# Patient Record
Sex: Female | Born: 1975 | Race: White | Hispanic: No | Marital: Married | Smoking: Current every day smoker
Health system: Southern US, Community
[De-identification: ages and names within clinical notes are randomized; demographics above are authoritative.]

## PROBLEM LIST (undated history)

## (undated) DIAGNOSIS — C50919 Malignant neoplasm of unspecified site of unspecified female breast: Secondary | ICD-10-CM

## (undated) HISTORY — PX: LAPAROSCOPIC BILATERAL SALPINGO OOPHERECTOMY: SHX5890

## (undated) HISTORY — PX: MASTECTOMY: SHX3

---

## 2011-11-06 ENCOUNTER — Emergency Department: Payer: Self-pay | Admitting: Emergency Medicine

## 2011-11-10 ENCOUNTER — Emergency Department: Payer: Self-pay | Admitting: Emergency Medicine

## 2017-08-13 ENCOUNTER — Encounter: Payer: Self-pay | Admitting: Emergency Medicine

## 2017-08-13 ENCOUNTER — Emergency Department
Admission: EM | Admit: 2017-08-13 | Discharge: 2017-08-13 | Disposition: A | Discharge status: Home / Self Care | Payer: Medicaid Other | Attending: Emergency Medicine | Admitting: Emergency Medicine

## 2017-08-13 ENCOUNTER — Emergency Department: Payer: Medicaid Other

## 2017-08-13 DIAGNOSIS — X500XXA Overexertion from strenuous movement or load, initial encounter: Secondary | ICD-10-CM | POA: Insufficient documentation

## 2017-08-13 DIAGNOSIS — Y939 Activity, unspecified: Secondary | ICD-10-CM | POA: Insufficient documentation

## 2017-08-13 DIAGNOSIS — S92354A Nondisplaced fracture of fifth metatarsal bone, right foot, initial encounter for closed fracture: Secondary | ICD-10-CM | POA: Diagnosis not present

## 2017-08-13 DIAGNOSIS — S99921A Unspecified injury of right foot, initial encounter: Secondary | ICD-10-CM | POA: Diagnosis present

## 2017-08-13 DIAGNOSIS — W108XXA Fall (on) (from) other stairs and steps, initial encounter: Secondary | ICD-10-CM | POA: Diagnosis not present

## 2017-08-13 DIAGNOSIS — F17210 Nicotine dependence, cigarettes, uncomplicated: Secondary | ICD-10-CM | POA: Diagnosis not present

## 2017-08-13 DIAGNOSIS — Y929 Unspecified place or not applicable: Secondary | ICD-10-CM | POA: Diagnosis not present

## 2017-08-13 DIAGNOSIS — Y999 Unspecified external cause status: Secondary | ICD-10-CM | POA: Diagnosis not present

## 2017-08-13 HISTORY — DX: Malignant neoplasm of unspecified site of unspecified female breast: C50.919

## 2017-08-13 MED ORDER — OXYCODONE-ACETAMINOPHEN 5-325 MG PO TABS: 1.0000 | ORAL_TABLET | Freq: Four times a day (QID) | ORAL | 0 refills | Status: DC | PRN

## 2017-08-13 MED ORDER — NAPROXEN 500 MG PO TABS: 500.0000 mg | ORAL_TABLET | Freq: Two times a day (BID) | ORAL | Status: DC

## 2017-08-13 NOTE — Discharge Instructions (Signed)
Open shoe for 2 weeks as directed.

## 2017-08-13 NOTE — ED Triage Notes (Signed)
R foot pain. States last night about 11pm had episode of SVT, was going down stairs to go to hospital to be seen and got weak and twisted R foot. Patient was seen at [facility] hospital for SVT which is resolved but was not worked up for toot at that time. Foot swollen and painful.

## 2017-08-13 NOTE — ED Provider Notes (Signed)
[facility] Emergency Department Provider Note   ____________________________________________   First MD Initiated Contact with Patient 08/13/17 1341     (approximate)  I have reviewed the triage vital signs and the nursing notes.   HISTORY  Chief Complaint Foot Pain    HPI Brenda Bryan is a 41 y.o. female patient complaining of right foot pain secondary to a twisting incident. Patient state last night she had as episode of SVT was going down some steps when she got weak and was her right foot. Patient was treated for the SVT in [facility] but was not evaluated for the foot pain which have become more swollen and painful.Patient rates pain as a 10 over 10. No palliative measures for this complaint.   Past Medical History:  Diagnosis Date  . Breast cancer ([place])     There are no active problems to display for this patient.   Past Surgical History:  Procedure Laterality Date  . LAPAROSCOPIC BILATERAL SALPINGO OOPHERECTOMY    . MASTECTOMY Bilateral     Prior to Admission medications   Medication Sig Start Date End Date Taking? Authorizing Provider  naproxen (NAPROSYN) 500 MG tablet Take 1 tablet (500 mg total) by mouth 2 (two) times daily with a meal. 08/13/17   Sable Feil, PA-C  oxyCODONE-acetaminophen (ROXICET) 5-325 MG tablet Take 1 tablet by mouth every 6 (six) hours as needed. 08/13/17 08/13/18  Sable Feil, PA-C    Allergies Patient has no known allergies.  No family history on file.  Social History Social History  Substance Use Topics  . Smoking status: Current Every Day Smoker    Packs/day: 0.50    Types: Cigarettes  . Smokeless tobacco: Not on file  . Alcohol use Not on file    Review of Systems Constitutional: No fever/chills Eyes: No visual changes. ENT: No sore throat. Cardiovascular: Denies chest pain. Respiratory: Denies shortness of breath. Gastrointestinal: No abdominal pain.  No nausea, no vomiting.  No  diarrhea.  No constipation. Genitourinary: Negative for dysuria. Musculoskeletal: Right foot pain Skin: Negative for rash. Neurological: Negative for headaches, focal weakness or numbness.   ____________________________________________   PHYSICAL EXAM:  VITAL SIGNS: ED Triage Vitals  Enc Vitals Group     BP 08/13/17 1257 (!) 152/89     Pulse Rate 08/13/17 1257 98     Resp 08/13/17 1257 20     Temp 08/13/17 1257 97.6 F (36.4 C)     Temp Source 08/13/17 1257 Oral     SpO2 08/13/17 1257 98 %     Weight 08/13/17 1257 150 lb (68 kg)     Height 08/13/17 1257 5\' 4"  (1.626 m)     Head Circumference --      Peak Flow --      Pain Score 08/13/17 1256 10     Pain Loc --      Pain Edu? --      Excl. in [place]? --     Constitutional: Alert and oriented. Well appearing and in no acute distress. Cardiovascular: Normal rate, regular rhythm. Grossly normal heart sounds.  Good peripheral circulation. Respiratory: Normal respiratory effort.  No retractions. Lungs CTAB. Musculoskeletal: No lower extremity tenderness nor edema.  No joint effusions.Right foot pain Neurologic:  Normal speech and language. No gross focal neurologic deficits are appreciated. No gait instability. Skin:  Skin is warm, dry and intact. No rash noted. Psychiatric: Mood and affect are normal. Speech and behavior are normal.  ____________________________________________  LABS (all labs ordered are listed, but only abnormal results are displayed)  Labs Reviewed - No data to display ____________________________________________  EKG   ____________________________________________  RADIOLOGY  Dg Foot Complete Right  Result Date: 08/13/2017 CLINICAL DATA:  Right foot pain and swelling after missing a step on stairs and twisting foot last night. Denies previous injury or surgery to right foot. Pt shielded. EXAM: RIGHT FOOT COMPLETE - 3+ VIEW COMPARISON:  None. FINDINGS: There is an oblique fracture of the fifth  metatarsal, associated soft tissue swelling. There is minimal displacement at the fracture site. IMPRESSION: Minimally displaced fifth metatarsal fracture. Electronically Signed   By: Nolon Nations M.D.   On: 08/13/2017 13:40    __X-rays reveal midshaft nondisplaced fractures of the fifth metatarsal the right foot. __________________________________________   PROCEDURES  Procedure(s) performed: None  Procedures  Critical Care performed: No  ____________________________________________   INITIAL IMPRESSION / ASSESSMENT AND PLAN / ED COURSE  Pertinent labs & imaging results that were available during my care of the patient were reviewed by me and considered in my medical decision making (see chart for details).  Foot pain and edema secondary to nondisplaced fractured fifth metatarsal right foot. Discuss x-ray finding with patient. Patient given discharge care instructions. Patient given open shoe and advised follow-up with podiatry for definitive evaluation and treatment.      ____________________________________________   FINAL CLINICAL IMPRESSION(S) / ED DIAGNOSES  Final diagnoses:  Nondisplaced fracture of fifth metatarsal bone, right foot, initial encounter for closed fracture      NEW MEDICATIONS STARTED DURING THIS VISIT:  New Prescriptions   NAPROXEN (NAPROSYN) 500 MG TABLET    Take 1 tablet (500 mg total) by mouth 2 (two) times daily with a meal.   OXYCODONE-ACETAMINOPHEN (ROXICET) 5-325 MG TABLET    Take 1 tablet by mouth every 6 (six) hours as needed.     Note:  This document was prepared using Dragon voice recognition software and may include unintentional dictation errors.    Sable Feil, PA-C 08/13/17 1356    Lavonia Drafts, MD 08/13/17 [phone]

## 2018-08-10 ENCOUNTER — Emergency Department
Admission: EM | Admit: 2018-08-10 | Discharge: 2018-08-10 | Disposition: A | Discharge status: Home / Self Care | Payer: Medicaid Other | Attending: Emergency Medicine | Admitting: Emergency Medicine

## 2018-08-10 ENCOUNTER — Other Ambulatory Visit: Payer: Self-pay

## 2018-08-10 ENCOUNTER — Emergency Department: Payer: Medicaid Other

## 2018-08-10 DIAGNOSIS — I471 Supraventricular tachycardia: Secondary | ICD-10-CM | POA: Insufficient documentation

## 2018-08-10 DIAGNOSIS — Z853 Personal history of malignant neoplasm of breast: Secondary | ICD-10-CM | POA: Insufficient documentation

## 2018-08-10 DIAGNOSIS — E860 Dehydration: Secondary | ICD-10-CM | POA: Insufficient documentation

## 2018-08-10 DIAGNOSIS — R002 Palpitations: Secondary | ICD-10-CM | POA: Diagnosis present

## 2018-08-10 DIAGNOSIS — F17210 Nicotine dependence, cigarettes, uncomplicated: Secondary | ICD-10-CM | POA: Insufficient documentation

## 2018-08-10 LAB — CBC
HCT: 51.8 % — ABNORMAL HIGH (ref 35.0–47.0)
Hemoglobin: 18.1 g/dL — ABNORMAL HIGH (ref 12.0–16.0)
MCH: 35.1 pg — ABNORMAL HIGH (ref 26.0–34.0)
MCHC: 34.9 g/dL (ref 32.0–36.0)
MCV: 100.5 fL — ABNORMAL HIGH (ref 80.0–100.0)
PLATELETS: 278 10*3/uL (ref 150–440)
RBC: 5.15 MIL/uL (ref 3.80–5.20)
RDW: 13 % (ref 11.5–14.5)
WBC: 11.2 10*3/uL — AB (ref 3.6–11.0)

## 2018-08-10 LAB — BASIC METABOLIC PANEL
ANION GAP: 13 (ref 5–15)
BUN: 11 mg/dL (ref 6–20)
CALCIUM: 9.2 mg/dL (ref 8.9–10.3)
CO2: 24 mmol/L (ref 22–32)
Chloride: 103 mmol/L (ref 98–111)
Creatinine, Ser: 0.84 mg/dL (ref 0.44–1.00)
GFR calc Af Amer: >60 mL/min (ref >60)
Glucose, Bld: 161 mg/dL — ABNORMAL HIGH (ref 70–99)
Potassium: 3.2 mmol/L — ABNORMAL LOW (ref 3.5–5.1)
SODIUM: 140 mmol/L (ref 135–145)

## 2018-08-10 LAB — MAGNESIUM: Magnesium: 1.9 mg/dL (ref 1.7–2.4)

## 2018-08-10 LAB — TSH: TSH: 1.442 u[IU]/mL (ref 0.350–4.500)

## 2018-08-10 MED ORDER — ADENOSINE 6 MG/2ML IV SOLN
6.0000 mg | Freq: Once | INTRAVENOUS | Status: AC
  Administered 2018-08-10: 6 mg via INTRAVENOUS

## 2018-08-10 MED ORDER — ADENOSINE 12 MG/4ML IV SOLN
12.0000 mg | Freq: Once | INTRAVENOUS | Status: AC
  Administered 2018-08-10: 12 mg via INTRAVENOUS

## 2018-08-10 MED ORDER — SODIUM CHLORIDE 0.9 % IV BOLUS
1000.0000 mL | Freq: Once | INTRAVENOUS | Status: AC
  Administered 2018-08-10: 1000 mL via INTRAVENOUS

## 2018-08-10 NOTE — ED Notes (Signed)
First Nurse Note: Patient walked into registration, color pale, diaphoretic.  HR rapid unable to count apically.  Placed in [facility] and taken to Rm 15.  Patient states she has hx of SVT and this episode has lasted for several hours without relief.  Alert and oriented.

## 2018-08-10 NOTE — ED Triage Notes (Signed)
Pt arrives via POV from home, arrives in SVT, HR 184. Dr. Alfred Levins bedside.

## 2018-08-10 NOTE — ED Provider Notes (Signed)
[facility] Emergency Department Provider Note  ____________________________________________  Time seen: Approximately 9:15 AM  I have reviewed the triage vital signs and the nursing notes.   HISTORY  Chief Complaint SVT   HPI Brenda Bryan is a 42 y.o. female history of breast cancer currently on remission and SVT who presents for evaluation of palpitations.  Patient reports that her symptoms started at midnight, she is complaining of palpitations and fast heart rate which has persisted throughout the night.  She has tried Valsalva maneuvers at home with no significant relief.  Patient has required 2 prior visits to the emergency room for medical conversion.  She denies chest pain or shortness of breath, fever chills, vomiting, personal or family history of blood clots, recent travel immobilization, leg pain or swelling.  Past Medical History:  Diagnosis Date  . Breast cancer [facility])     Past Surgical History:  Procedure Laterality Date  . LAPAROSCOPIC BILATERAL SALPINGO OOPHERECTOMY    . MASTECTOMY Bilateral     Prior to Admission medications   Not on File    Allergies Patient has no known allergies.  History reviewed. No pertinent family history.  Social History Social History   Tobacco Use  . Smoking status: Current Every Day Smoker    Packs/day: 0.50    Types: Cigarettes  . Smokeless tobacco: Never Used  Substance Use Topics  . Alcohol use: Yes    Alcohol/week: 3.0 standard drinks    Types: 3 Glasses of wine per week    Frequency: Never  . Drug use: Never    Review of Systems  Constitutional: Negative for fever. Eyes: Negative for visual changes. ENT: Negative for sore throat. Neck: No neck pain  Cardiovascular: Negative for chest pain. + palpitations Respiratory: Negative for shortness of breath. Gastrointestinal: Negative for abdominal pain, vomiting or diarrhea. Genitourinary: Negative for dysuria. Musculoskeletal: Negative  for back pain. Skin: Negative for rash. Neurological: Negative for headaches, weakness or numbness. Psych: No SI or HI  ____________________________________________   PHYSICAL EXAM:  VITAL SIGNS: ED Triage Vitals  Enc Vitals Group     BP 08/10/18 0849 (!) 177/125     Pulse Rate 08/10/18 0849 (!) 188     Resp 08/10/18 0849 (!) 23     Temp 08/10/18 0900 98.6 F (37 C)     Temp Source 08/10/18 0900 Oral     SpO2 08/10/18 0849 99 %     Weight 08/10/18 0900 149 lb 14.6 oz (68 kg)     Height 08/10/18 0900 5\' 4"  (1.626 m)     Head Circumference --      Peak Flow --      Pain Score 08/10/18 0900 0     Pain Loc --      Pain Edu? --      Excl. in [place]? --     Constitutional: Alert and oriented. Well appearing and in no apparent distress. HEENT:      Head: Normocephalic and atraumatic.         Eyes: Conjunctivae are normal. Sclera is non-icteric.       Mouth/Throat: Mucous membranes are moist.       Neck: Supple with no signs of meningismus. Cardiovascular: Regular rhythm with tachycardic rate. No murmurs, gallops, or rubs. 2+ symmetrical distal pulses are present in all extremities. No JVD. Respiratory: Normal respiratory effort. Lungs are clear to auscultation bilaterally. No wheezes, crackles, or rhonchi.  Gastrointestinal: Soft, non tender, and non distended with positive  bowel sounds. No rebound or guarding. Musculoskeletal: Nontender with normal range of motion in all extremities. No edema, cyanosis, or erythema of extremities. Neurologic: Normal speech and language. Face is symmetric. Moving all extremities. No gross focal neurologic deficits are appreciated. Skin: Skin is warm, dry and intact. No rash noted. Psychiatric: Mood and affect are normal. Speech and behavior are normal.  ____________________________________________   LABS (all labs ordered are listed, but only abnormal results are displayed)  Labs Reviewed  CBC - Abnormal; Notable for the following components:        Result Value   WBC 11.2 (*)    Hemoglobin 18.1 (*)    HCT 51.8 (*)    MCV 100.5 (*)    MCH 35.1 (*)    All other components within normal limits  BASIC METABOLIC PANEL - Abnormal; Notable for the following components:   Potassium 3.2 (*)    Glucose, Bld 161 (*)    All other components within normal limits  TSH  MAGNESIUM   ____________________________________________  EKG  ED ECG REPORT I, Rudene Re, the attending physician, personally viewed and interpreted this ECG.   08:46 -SVT, rate of 188, diffuse ST depressions with no ST elevation, left axis deviation.  08:55 -sinus tachycardia, rate of 121, normal intervals, borderline left axis deviation, significant improvement of the ST depressions with no ST elevation. __________________________________________  RADIOLOGY  I have personally reviewed the images performed during this visit and I agree with the Radiologist's read.   Interpretation by Radiologist:  Dg Chest 2 View  Result Date: 08/10/2018 CLINICAL DATA:  Palpitations and tachycardia. EXAM: CHEST - 2 VIEW COMPARISON:  Chest x-ray dated November 06, 2011. FINDINGS: The heart size and mediastinal contours are within normal limits. Normal pulmonary vascularity. The lungs are hyperinflated. No focal consolidation, pleural effusion, or pneumothorax. No acute osseous abnormality. IMPRESSION: 1.  No active cardiopulmonary disease. 2. Hyperinflation suggests COPD given smoking history. Electronically Signed   By: Titus Dubin M.D.   On: 08/10/2018 09:57      ____________________________________________   PROCEDURES  Procedure(s) performed:yes .Cardioversion Date/Time: 08/10/2018 11:20 AM Performed by: Rudene Re, MD Authorized by: Rudene Re, MD   Consent:    Consent obtained:  Verbal   Consent given by:  Patient Pre-procedure details:    Cardioversion basis:  Emergent   Rhythm:  Supraventricular tachycardia Attempt one:     Cardioversion mode attempt one: adenosine.   Shock outcome:  No change in rhythm Attempt two:    Cardioversion mode attempt two: adenosine.   Shock outcome:  Conversion to normal sinus rhythm Post-procedure details:    Patient status:  Awake   Patient tolerance of procedure:  Tolerated well, no immediate complications   Critical Care performed:  None ____________________________________________   INITIAL IMPRESSION / ASSESSMENT AND PLAN / ED COURSE  42 y.o. female history of breast cancer currently on remission and SVT who presents for evaluation of palpitations.  Patient arrives in SVT with ventricular rate in the 180s/190s.  She was cardioverted medically initially and successfully with 6 mg of adenosine.  Patient converted after 12 mg of adenosine.  Repeat x-ray showing sinus tachycardia with no acute ischemia.  Patient be given fluids.  Labs will be checked to rule out anemia, electrolyte abnormalities, thyroid dysfunction as possible causes of patient's symptoms.    _________________________ 11:19 AM on 08/10/2018 -----------------------------------------  Labs concerning for dehydration with hemoconcentration.  Mild hypokalemia which was supplemented p.o.  Patient remains in normal sinus rhythm and  her tachycardia resolved after IV fluids.  She remains with no chest pain or shortness of breath and no clinical concerns at this time for PE.  Discussed increase oral hydration and at this time will refer patient to cardiology for further evaluation.  Recommended return to the emergency room if she develops chest pain, shortness of breath or recurrence of her palpitations.   As part of my medical decision making, I reviewed the following data within the [place] notes reviewed and incorporated, Labs reviewed , EKG interpreted , Old EKG reviewed, Old chart reviewed, Radiograph reviewed , Notes from prior ED visits and [place] Controlled Substance  Database    Pertinent labs & imaging results that were available during my care of the patient were reviewed by me and considered in my medical decision making (see chart for details).    ____________________________________________   FINAL CLINICAL IMPRESSION(S) / ED DIAGNOSES  Final diagnoses:  SVT (supraventricular tachycardia) ([place])  Dehydration      NEW MEDICATIONS STARTED DURING THIS VISIT:  ED Discharge Orders    None       Note:  This document was prepared using Dragon voice recognition software and may include unintentional dictation errors.    Alfred Levins, [place], MD 08/10/18 [phone]

## 2018-08-10 NOTE — ED Notes (Signed)
Converts to ST

## 2018-08-10 NOTE — ED Notes (Signed)
No change in rate

## 2019-12-11 ENCOUNTER — Ambulatory Visit
Admission: EM | Admit: 2019-12-11 | Discharge: 2019-12-11 | Disposition: A | Discharge status: Home / Self Care | Payer: Medicaid Other

## 2019-12-11 ENCOUNTER — Other Ambulatory Visit: Payer: Self-pay

## 2019-12-11 ENCOUNTER — Encounter: Payer: Self-pay | Admitting: Emergency Medicine

## 2019-12-11 DIAGNOSIS — Z20822 Contact with and (suspected) exposure to covid-19: Secondary | ICD-10-CM | POA: Diagnosis not present

## 2019-12-11 NOTE — ED Provider Notes (Signed)
[facility]    CSN: [id number] Arrival date & time: 12/11/19  0807      History   Chief Complaint Chief Complaint  Patient presents with  . COVID Exposure    HPI Brenda Bryan is a 44 y.o. female w/ h/o breast cancer in remission x 63yrs   Presenting for Covid testing: Exposure: mother in law who tested positive Date of exposure: 12/26 Any fever, symptoms since exposure: none   Past Medical History:  Diagnosis Date  . Breast cancer ([place])     There are no problems to display for this patient.   Past Surgical History:  Procedure Laterality Date  . LAPAROSCOPIC BILATERAL SALPINGO OOPHERECTOMY    . MASTECTOMY Bilateral     OB History   No obstetric history on file.      Home Medications    Prior to Admission medications   Medication Sig Start Date End Date Taking? Authorizing Provider  lisinopril (ZESTRIL) 10 MG tablet Take 5 mg by mouth daily.   Yes [provider]  metoprolol tartrate (LOPRESSOR) 100 MG tablet Take 100 mg by mouth 2 (two) times daily.   Yes [provider]    Family History Family History  Problem Relation Age of Onset  . Heart disease Mother   . COPD Father   . Hepatitis C Father     Social History Social History   Tobacco Use  . Smoking status: Current Every Day Smoker    Packs/day: 0.50    Types: Cigarettes  . Smokeless tobacco: Never Used  Substance Use Topics  . Alcohol use: Yes    Alcohol/week: 3.0 standard drinks    Types: 3 Glasses of wine per week  . Drug use: Never     Allergies   Patient has no known allergies.   Review of Systems Review of Systems  Constitutional: Negative for fatigue and fever.  HENT: Negative for congestion, dental problem, ear pain, facial swelling, hearing loss, sinus pain, sore throat, trouble swallowing and voice change.   Eyes: Negative for photophobia, pain and visual disturbance.  Respiratory: Negative for cough and shortness of breath.     Cardiovascular: Negative for chest pain and palpitations.  Gastrointestinal: Negative for diarrhea and vomiting.  Musculoskeletal: Negative for arthralgias and myalgias.  Neurological: Negative for dizziness and headaches.     Physical Exam Triage Vital Signs ED Triage Vitals  Enc Vitals Group     BP      Pulse      Resp      Temp      Temp src      SpO2      Weight      Height      Head Circumference      Peak Flow      Pain Score      Pain Loc      Pain Edu?      Excl. in [place]?    No data found.  Updated Vital Signs BP 116/81 (BP Location: Left Arm)   Pulse 89   Temp (!) 97.5 F (36.4 C) (Temporal)   Resp 16   SpO2 98%   Visual Acuity Right Eye Distance:   Left Eye Distance:   Bilateral Distance:    Right Eye Near:   Left Eye Near:    Bilateral Near:     Physical Exam Constitutional:      General: She is not in acute distress. HENT:     Head: Normocephalic  and atraumatic.  Eyes:     General: No scleral icterus.    Pupils: Pupils are equal, round, and reactive to light.  Cardiovascular:     Rate and Rhythm: Normal rate.  Pulmonary:     Effort: Pulmonary effort is normal. No respiratory distress.     Breath sounds: No wheezing.  Skin:    Coloration: Skin is not jaundiced or pale.  Neurological:     Mental Status: She is alert and oriented to person, place, and time.      UC Treatments / Results  Labs (all labs ordered are listed, but only abnormal results are displayed) Labs Reviewed  NOVEL CORONAVIRUS, NAA    EKG   Radiology No results found.  Procedures Procedures (including critical care time)  Medications Ordered in UC Medications - No data to display  Initial Impression / Assessment and Plan / UC Course  I have reviewed the triage vital signs and the nursing notes.  Pertinent labs & imaging results that were available during my care of the patient were reviewed by me and considered in my medical decision making (see chart  for details).     Patient afebrile, nontoxic, with SpO2 98%.  Covid PCR pending.  Patient to quarantine until results are back.  We will continue supportive management.  Return precautions discussed, patient verbalized understanding and is agreeable to plan. Final Clinical Impressions(s) / UC Diagnoses   Final diagnoses:  Exposure to COVID-19 virus     Discharge Instructions     Your COVID test is pending - it is important to quarantine / isolate at home until your results are back. If you test positive and would like further evaluation for persistent or worsening symptoms, you may schedule an E-visit or virtual (video) visit throughout the [facility] app or website.  PLEASE NOTE: If you develop severe chest pain or shortness of breath please go to the ER or call 9-1-1 for further evaluation --> DO NOT schedule electronic or virtual visits for this. Please call our office for further guidance / recommendations as needed.    ED Prescriptions    None     PDMP not reviewed this encounter.   [place], [place], [place] 12/11/19 [phone]

## 2019-12-11 NOTE — Discharge Instructions (Signed)
Your COVID test is pending - it is important to quarantine / isolate at home until your results are back. °If you test positive and would like further evaluation for persistent or worsening symptoms, you may schedule an E-visit or virtual (video) visit throughout the [facility] MyChart app or website. ° °PLEASE NOTE: If you develop severe chest pain or shortness of breath please go to the ER or call 9-1-1 for further evaluation --> DO NOT schedule electronic or virtual visits for this. °Please call our office for further guidance / recommendations as needed. °

## 2019-12-11 NOTE — ED Triage Notes (Signed)
Pt presents to [facility] for assessment after being exposed to someone positive for COVID on 12/26.  Denies any symptoms at this time.

## 2019-12-13 LAB — NOVEL CORONAVIRUS, NAA: SARS-CoV-2, NAA: NOT DETECTED

## 2019-12-15 IMAGING — CR DG CHEST 2V
2 series · 2 of 2 positions shown · non-contrast
Comparison: Chest x-ray dated November 06, 2011.

CLINICAL DATA: Palpitations and tachycardia.

EXAM:
CHEST - 2 VIEW

[chest pa]
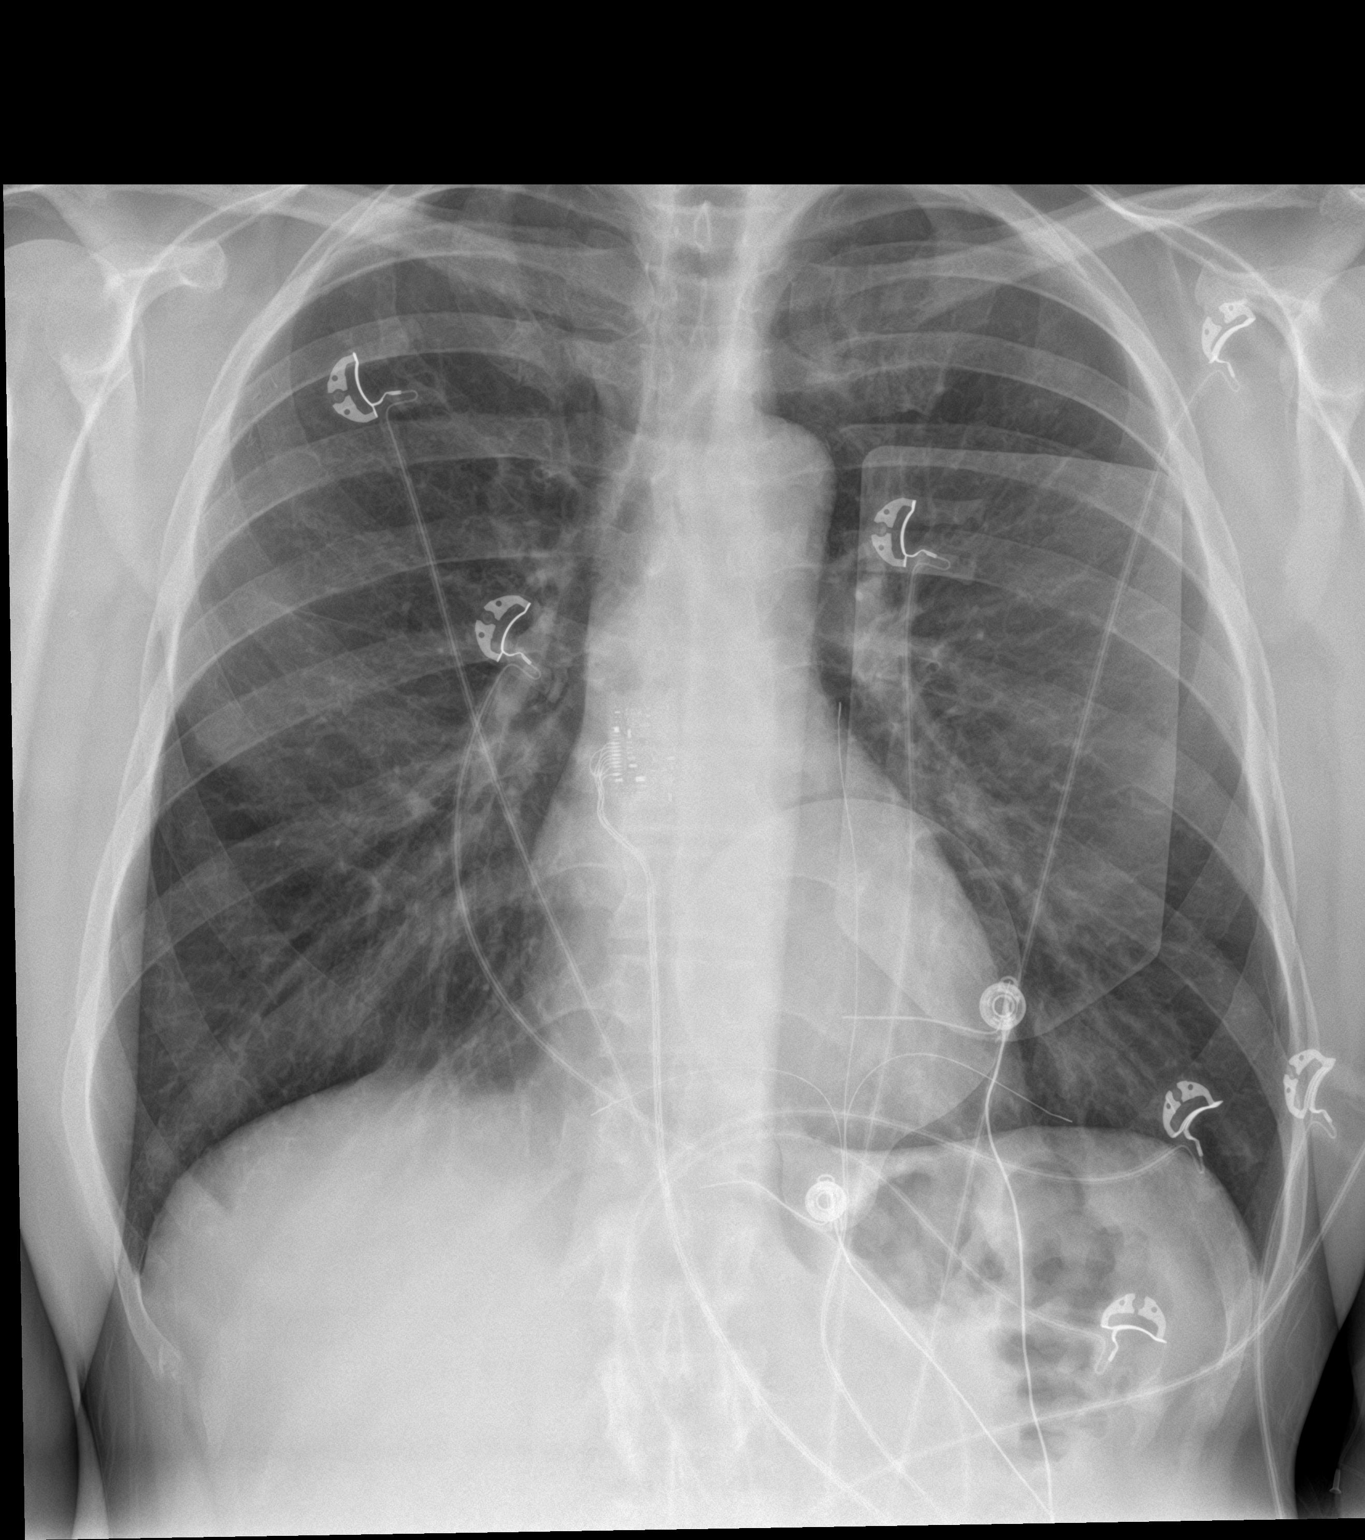

[chest lat]
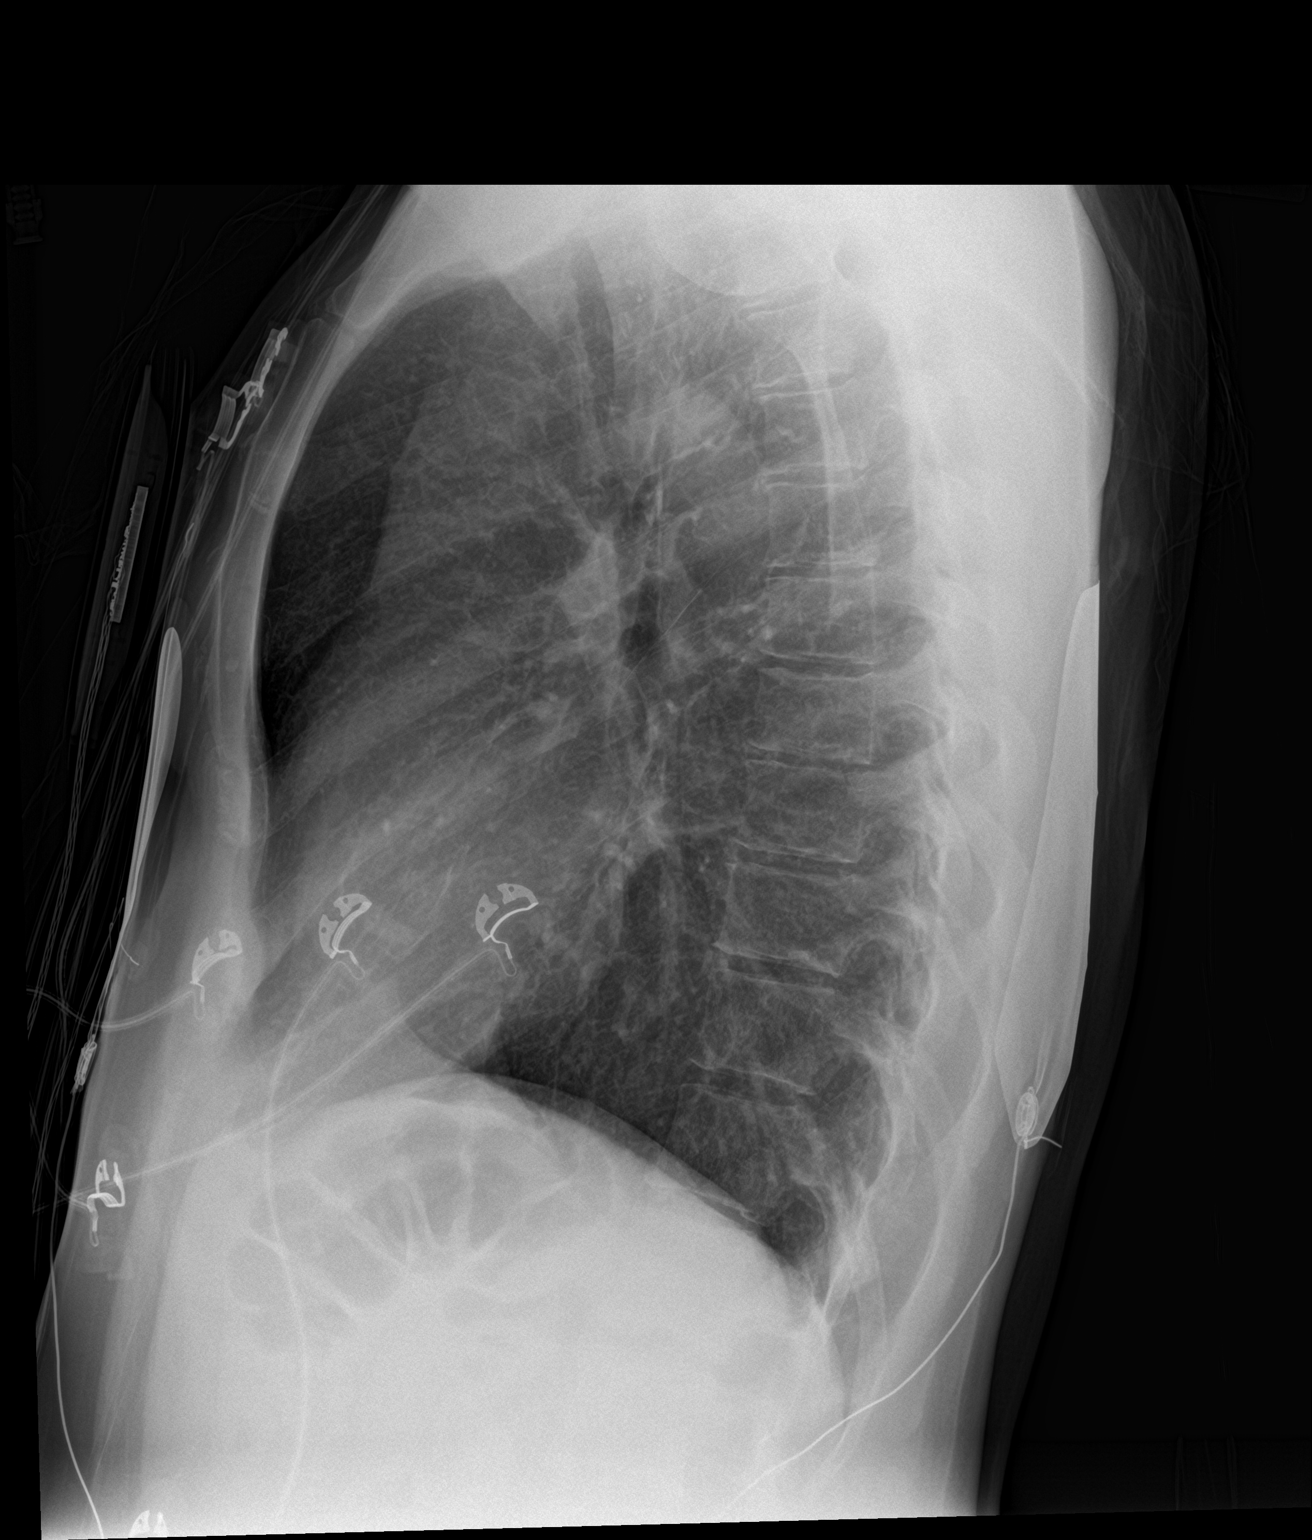

[2 of 2 positions shown; findings below may reference images not displayed]

FINDINGS: The heart size and mediastinal contours are within normal limits.
Normal pulmonary vascularity. The lungs are hyperinflated. No focal
consolidation, pleural effusion, or pneumothorax. No acute osseous
abnormality.
IMPRESSION: 1.  No active cardiopulmonary disease.
2. Hyperinflation suggests COPD given smoking history.

## 2025-01-06 ENCOUNTER — Encounter (HOSPITAL_COMMUNITY): Payer: Self-pay | Admitting: Psychiatry

## 2025-01-06 ENCOUNTER — Inpatient Hospital Stay (HOSPITAL_COMMUNITY): Admission: AD | Admit: 2025-01-06 | Discharge: 2025-01-09 | Disposition: A | Source: Other Acute Inpatient Hospital

## 2025-01-06 ENCOUNTER — Other Ambulatory Visit: Payer: Self-pay

## 2025-01-06 DIAGNOSIS — I10 Essential (primary) hypertension: Secondary | ICD-10-CM | POA: Insufficient documentation

## 2025-01-06 DIAGNOSIS — F1020 Alcohol dependence, uncomplicated: Secondary | ICD-10-CM | POA: Insufficient documentation

## 2025-01-06 DIAGNOSIS — F322 Major depressive disorder, single episode, severe without psychotic features: Principal | ICD-10-CM | POA: Diagnosis present

## 2025-01-06 DIAGNOSIS — F17200 Nicotine dependence, unspecified, uncomplicated: Secondary | ICD-10-CM | POA: Insufficient documentation

## 2025-01-06 DIAGNOSIS — F411 Generalized anxiety disorder: Secondary | ICD-10-CM | POA: Insufficient documentation

## 2025-01-06 MED ORDER — ALUM & MAG HYDROXIDE-SIMETH 200-200-20 MG/5ML PO SUSP: 30.0000 mL | ORAL | Status: DC | PRN

## 2025-01-06 MED ORDER — HALOPERIDOL LACTATE 5 MG/ML IJ SOLN: 5.0000 mg | Freq: Three times a day (TID) | INTRAMUSCULAR | Status: DC | PRN

## 2025-01-06 MED ORDER — CHLORDIAZEPOXIDE HCL 25 MG PO CAPS
25.0000 mg | ORAL_CAPSULE | ORAL | Status: AC
  Administered 2025-01-07 (×2): 25 mg via ORAL
  Filled 2025-01-06 (×2): qty 1

## 2025-01-06 MED ORDER — CARVEDILOL 12.5 MG PO TABS
12.5000 mg | ORAL_TABLET | Freq: Two times a day (BID) | ORAL | Status: DC
  Administered 2025-01-06 – 2025-01-09 (×4): 12.5 mg via ORAL
  Filled 2025-01-06 (×5): qty 1

## 2025-01-06 MED ORDER — DIPHENHYDRAMINE HCL 50 MG/ML IJ SOLN: 50.0000 mg | Freq: Three times a day (TID) | INTRAMUSCULAR | Status: DC | PRN

## 2025-01-06 MED ORDER — TRAZODONE HCL 50 MG PO TABS
50.0000 mg | ORAL_TABLET | Freq: Every evening | ORAL | Status: DC | PRN
  Administered 2025-01-06: 50 mg via ORAL
  Filled 2025-01-06: qty 1

## 2025-01-06 MED ORDER — ONDANSETRON 4 MG PO TBDP: 4.0000 mg | ORAL_TABLET | Freq: Four times a day (QID) | ORAL | Status: DC | PRN

## 2025-01-06 MED ORDER — LOPERAMIDE HCL 2 MG PO CAPS: 2.0000 mg | ORAL_CAPSULE | ORAL | Status: DC | PRN

## 2025-01-06 MED ORDER — HYDROXYZINE HCL 25 MG PO TABS: 25.0000 mg | ORAL_TABLET | Freq: Four times a day (QID) | ORAL | Status: DC | PRN

## 2025-01-06 MED ORDER — CHLORDIAZEPOXIDE HCL 25 MG PO CAPS
25.0000 mg | ORAL_CAPSULE | Freq: Every day | ORAL | Status: AC
  Administered 2025-01-08: 25 mg via ORAL
  Filled 2025-01-06: qty 1

## 2025-01-06 MED ORDER — VITAMIN B-1 100 MG PO TABS
100.0000 mg | ORAL_TABLET | Freq: Every day | ORAL | Status: DC
  Administered 2025-01-07 – 2025-01-09 (×3): 100 mg via ORAL
  Filled 2025-01-06 (×3): qty 1

## 2025-01-06 MED ORDER — HALOPERIDOL LACTATE 5 MG/ML IJ SOLN: 10.0000 mg | Freq: Three times a day (TID) | INTRAMUSCULAR | Status: DC | PRN

## 2025-01-06 MED ORDER — ACETAMINOPHEN 325 MG PO TABS: 650.0000 mg | ORAL_TABLET | Freq: Four times a day (QID) | ORAL | Status: DC | PRN

## 2025-01-06 MED ORDER — DIPHENHYDRAMINE HCL 25 MG PO CAPS: 50.0000 mg | ORAL_CAPSULE | Freq: Three times a day (TID) | ORAL | Status: DC | PRN

## 2025-01-06 MED ORDER — NICOTINE 21 MG/24HR TD PT24
21.0000 mg | MEDICATED_PATCH | Freq: Every day | TRANSDERMAL | Status: DC
  Administered 2025-01-07 – 2025-01-09 (×3): 21 mg via TRANSDERMAL
  Filled 2025-01-06 (×3): qty 1

## 2025-01-06 MED ORDER — CHLORDIAZEPOXIDE HCL 25 MG PO CAPS
25.0000 mg | ORAL_CAPSULE | Freq: Three times a day (TID) | ORAL | Status: AC
  Administered 2025-01-06: 25 mg via ORAL
  Filled 2025-01-06: qty 1

## 2025-01-06 MED ORDER — HALOPERIDOL 5 MG PO TABS: 5.0000 mg | ORAL_TABLET | Freq: Three times a day (TID) | ORAL | Status: DC | PRN

## 2025-01-06 MED ORDER — CHLORDIAZEPOXIDE HCL 25 MG PO CAPS
25.0000 mg | ORAL_CAPSULE | Freq: Four times a day (QID) | ORAL | Status: AC
  Administered 2025-01-06: 25 mg via ORAL
  Filled 2025-01-06: qty 1

## 2025-01-06 MED ORDER — ADULT MULTIVITAMIN W/MINERALS CH
1.0000 | ORAL_TABLET | Freq: Every day | ORAL | Status: DC
  Administered 2025-01-07 – 2025-01-09 (×3): 1 via ORAL
  Filled 2025-01-06 (×3): qty 1

## 2025-01-06 MED ORDER — LORAZEPAM 2 MG/ML IJ SOLN: 2.0000 mg | Freq: Three times a day (TID) | INTRAMUSCULAR | Status: DC | PRN

## 2025-01-06 MED ORDER — CHLORDIAZEPOXIDE HCL 25 MG PO CAPS: 25.0000 mg | ORAL_CAPSULE | Freq: Four times a day (QID) | ORAL | Status: DC | PRN

## 2025-01-06 MED ORDER — ESCITALOPRAM OXALATE 20 MG PO TABS
20.0000 mg | ORAL_TABLET | Freq: Two times a day (BID) | ORAL | Status: DC
  Administered 2025-01-06 – 2025-01-07 (×2): 20 mg via ORAL
  Filled 2025-01-06 (×2): qty 2

## 2025-01-06 MED ORDER — MAGNESIUM HYDROXIDE 400 MG/5ML PO SUSP: 30.0000 mL | Freq: Every day | ORAL | Status: DC | PRN

## 2025-01-06 NOTE — Plan of Care (Signed)
   Problem: Education: Goal: Knowledge of [place] [facility] Education information/materials will improve Outcome: Progressing Goal: Emotional status will improve Outcome: Progressing Goal: Mental status will improve Outcome: Progressing

## 2025-01-06 NOTE — Progress Notes (Signed)
" °   01/06/25 1700  Psych Admission Type (Psych Patients Only)  Admission Status Involuntary  Psychosocial Assessment  Patient Complaints Depression;Anxiety;Substance abuse  Eye Contact Fair  Facial Expression Flat;Sad  Affect Depressed  Speech Logical/coherent;Soft  Interaction Minimal  Motor Activity Slow  Appearance/Hygiene In hospital gown;In scrubs  Behavior Characteristics Cooperative;Anxious  Mood Depressed;Anxious  Thought Process  Coherency WDL  Content WDL  Delusions None reported or observed  Perception WDL  Hallucination None reported or observed  Judgment Impaired  Confusion None  Danger to Self  Current suicidal ideation? Denies  Danger to Others  Danger to Others None reported or observed    "

## 2025-01-06 NOTE — Group Note (Signed)
 Date:  01/06/2025 Time:  10:30 PM  Group Topic/Focus:  Wrap-Up Group:   The focus of this group is to help patients review their daily goal of treatment and discuss progress on daily workbooks.    Participation Level:  Active  Participation Quality:  Appropriate and Sharing  Affect:  Appropriate and Flat  Cognitive:  Appropriate  Insight: Appropriate  Engagement in Group:  Engaged  Modes of Intervention:  Activity and Socialization  Additional Comments:  Patients completed wrap up group sheets and shared. Patient stated that he is feeling, optimistic. Patient rated her day a 7. Because I was finally able to leave the hospital and get here. High point from patient day, being able to have actual human interaction. Low point from patient day, lying in bed for hours at hospital.   Eward Mace 01/06/2025, 10:30 PM

## 2025-01-07 ENCOUNTER — Encounter (HOSPITAL_COMMUNITY): Payer: Self-pay

## 2025-01-07 DIAGNOSIS — F1020 Alcohol dependence, uncomplicated: Secondary | ICD-10-CM | POA: Insufficient documentation

## 2025-01-07 DIAGNOSIS — F411 Generalized anxiety disorder: Secondary | ICD-10-CM | POA: Insufficient documentation

## 2025-01-07 DIAGNOSIS — F17200 Nicotine dependence, unspecified, uncomplicated: Secondary | ICD-10-CM | POA: Insufficient documentation

## 2025-01-07 DIAGNOSIS — I10 Essential (primary) hypertension: Secondary | ICD-10-CM | POA: Insufficient documentation

## 2025-01-07 MED ORDER — TRAZODONE HCL 50 MG PO TABS
50.0000 mg | ORAL_TABLET | Freq: Every day | ORAL | Status: DC
  Administered 2025-01-07 – 2025-01-08 (×2): 50 mg via ORAL
  Filled 2025-01-07 (×2): qty 1

## 2025-01-07 MED ORDER — HYDROXYZINE HCL 50 MG PO TABS
50.0000 mg | ORAL_TABLET | Freq: Four times a day (QID) | ORAL | Status: DC | PRN
  Administered 2025-01-08: 50 mg via ORAL
  Filled 2025-01-07: qty 1

## 2025-01-07 MED ORDER — NALTREXONE HCL 50 MG PO TABS
50.0000 mg | ORAL_TABLET | Freq: Every day | ORAL | Status: DC
  Administered 2025-01-07 – 2025-01-09 (×3): 50 mg via ORAL
  Filled 2025-01-07 (×3): qty 1

## 2025-01-07 MED ORDER — ESCITALOPRAM OXALATE 20 MG PO TABS
20.0000 mg | ORAL_TABLET | Freq: Every day | ORAL | Status: DC
  Administered 2025-01-08 – 2025-01-09 (×2): 20 mg via ORAL
  Filled 2025-01-07 (×2): qty 2

## 2025-01-07 MED ORDER — ESCITALOPRAM OXALATE 10 MG PO TABS
10.0000 mg | ORAL_TABLET | Freq: Every day | ORAL | Status: AC
  Administered 2025-01-07: 10 mg via ORAL
  Filled 2025-01-07: qty 1

## 2025-01-07 NOTE — Group Note (Signed)
 Date:  01/07/2025 Time:  3:49 PM  Group Topic/Focus: Write a letter to your past or future self Emotional Education:   The focus of this group is to discuss what feelings/emotions are, and how they are experienced.    Participation Level:  Active  Participation Quality:  Appropriate  Affect:  Appropriate  Cognitive:  Appropriate  Insight: Appropriate  Engagement in Group:  Engaged  Modes of Intervention:  Discussion  Additional Comments:  Patient engaged appropriately during group.   Brenda Bryan D Brenda Bryan 01/07/2025, 3:49 PM

## 2025-01-07 NOTE — Progress Notes (Signed)
" °   01/07/25 1600  Psych Admission Type (Psych Patients Only)  Admission Status Voluntary/72 hour document signed  Psychosocial Assessment  Patient Complaints Anxiety  Eye Contact Fair  Facial Expression Flat  Affect Depressed  Speech Logical/coherent  Interaction Minimal  Motor Activity Slow  Appearance/Hygiene Unremarkable  Behavior Characteristics Cooperative  Mood Depressed;Anxious  Thought Process  Coherency WDL  Content WDL  Delusions None reported or observed  Perception WDL  Hallucination None reported or observed  Judgment Impaired  Confusion None  Danger to Self  Current suicidal ideation? Denies  Agreement Not to Harm Self Yes  Description of Agreement Verbal  Danger to Others  Danger to Others None reported or observed    "

## 2025-01-07 NOTE — B[facility] Group Notes (Signed)
 [facility] Group Notes:  (Nursing/MHT/Case Management/Adjunct)  Date:  01/07/2025  Time:  9:11 PM  Type of Therapy:  Group Therapy  Participation Level:  Minimal  Participation Quality:  Appropriate  Affect:  Appropriate  Cognitive:  Appropriate  Insight:  Appropriate  Engagement in Group:  Developing/Improving  Modes of Intervention:  Education  Summary of Progress/Problems: Patient attended the evening N.A. meeting and was appropriate.   Assad Harbeson S 01/07/2025, 9:11 PM

## 2025-01-07 NOTE — Plan of Care (Signed)
   Problem: Education: Goal: Knowledge of [place] [facility] Education information/materials will improve Outcome: Progressing Goal: Emotional status will improve Outcome: Progressing Goal: Mental status will improve Outcome: Progressing

## 2025-01-07 NOTE — BHH Suicide Risk Assessment (Signed)
 Suicide Risk Assessment  Admission Assessment    [facility] Admission Suicide Risk Assessment   Nursing information obtained from:    Demographic factors:  Unemployed Current Mental Status:  NA Loss Factors:  Loss of significant relationship Historical Factors:  Impulsivity Risk Reduction Factors:  Sense of responsibility to family  Total Time spent with patient: 1.5 hours Principal Problem: MDD (major depressive disorder), severe (HCC) Diagnosis:  Principal Problem:   MDD (major depressive disorder), severe (HCC)  Subjective Data: Brenda Bryan is a 49 year old female with a reported history of major depressive disorder, generalized anxiety disorder with panic attacks, and alcohol use disorder. She initially presented to [facility] following an alleged suicidal gesture involving self-inflicted superficial lacerations to her left wrist with scissors in the context of an attempt to end her life. She endorsed worsening depressive symptoms, increased alcohol use, and multiple psychosocial stressors over the preceding three weeks, including a recent breakup with her boyfriend, financial concerns, and recurrent panic attacks. Given the acute safety concerns, after medical clearance, the patient was admitted to the [facility] for safety, stabilization, and further psychiatric evaluation and treatment. She denies any prior psychiatric hospitalizations, suicide attempts, or history of self-harming behaviors.  Medical history is notable for breast cancer, double mastectomy, hypertension, and hysterectomy.   Per IVC Petition: Patient presents for suicidal gesture and ideations including scratching her forearms with scissors in an attempt to take her life.  She reports feeling like a burden to her son.  She recently moved out and is living with her son.  She has multiple stressors in her life and reports reportedly just does not want to go forward with wife, cannot take it any longer... .   At this time I do believe the patient serves as a risk to herself.   Continued Clinical Symptoms:  Alcohol Use Disorder Identification Test Final Score (AUDIT): 25 The Alcohol Use Disorders Identification Test, Guidelines for Use in Primary Care, Second Edition.  World Science Writer [facility]). Score between 0-7:  no or low risk or alcohol related problems. Score between 8-15:  moderate risk of alcohol related problems. Score between 16-19:  high risk of alcohol related problems. Score 20 or above:  warrants further diagnostic evaluation for alcohol dependence and treatment.   CLINICAL FACTORS:   Severe Anxiety and/or Agitation Panic Attacks Depression:   Comorbid alcohol abuse/dependence Impulsivity Insomnia Recent sense of peace/wellbeing Severe Alcohol/Substance Abuse/Dependencies More than one psychiatric diagnosis Previous Psychiatric Diagnoses and Treatments Medical Diagnoses and Treatments/Surgeries   Musculoskeletal: Strength & Muscle Tone: within normal limits Gait & Station: normal Patient leans: N/A  Psychiatric Specialty Exam:  Presentation  General Appearance: Appropriate for Environment (Wearing hospital scrubs and gown)  Eye Contact:Good  Speech:Clear and Coherent; Normal Rate  Speech Volume:Normal  Handedness:No data recorded  Mood and Affect  Mood:Anxious; Euthymic  Affect:Congruent   Thought Process  Thought Processes:Coherent; Goal Directed  Descriptions of Associations:Intact  Orientation:Full (Time, Place and Person)  Thought Content:Logical  History of Schizophrenia/Schizoaffective disorder:No data recorded Duration of Psychotic Symptoms:No data recorded Hallucinations:Hallucinations: None  Ideas of Reference:None  Suicidal Thoughts:Suicidal Thoughts: No  Homicidal Thoughts:Homicidal Thoughts: No   Sensorium  Memory:Immediate Good; Recent Good; Remote Good  Judgment:Poor  Insight:Fair   Executive Functions   Concentration:Good  Attention Span:Good  Recall:Good  Fund of Knowledge:Good  Language:Good   Psychomotor Activity  Psychomotor Activity:Psychomotor Activity: Normal   Assets  Assets:Desire for Improvement; Housing; Resilience; Social Support; Talents/Skills   Sleep  Sleep:Sleep: Poor  Physical Exam: Physical Exam ROS Blood pressure (!) 95/49, pulse 66, temperature 97.7 F (36.5 C), temperature source Oral, resp. rate 16, height 5' 5 (1.651 m), weight 66.7 kg, SpO2 99%. Body mass index is 24.46 kg/m.   COGNITIVE FEATURES THAT CONTRIBUTE TO RISK:  None    SUICIDE RISK:   Moderate:  Frequent suicidal ideation with limited intensity, and duration, some specificity in terms of plans, no associated intent, good self-control, limited dysphoria/symptomatology, some risk factors present, and identifiable protective factors, including available and accessible social support.  PLAN OF CARE: See H&P for assessment and plan   I certify that inpatient services furnished can reasonably be expected to improve the patient's condition.   Blair Chiquita Hint, NP 01/07/2025, 12:18 PM

## 2025-01-07 NOTE — Group Note (Signed)
 Recreation Therapy Group Note   Group Topic:Communication  Group Date: 01/07/2025 Start Time: 0930 End Time: 1000 Facilitators: Elisah Parmer-McCall, LRT,CTRS Location: [address]   Group Topic: Communication, Team Building, Problem Solving  Goal Area(s) Addresses:  Patient will effectively work with peer towards shared goal.  Patient will identify skills used to make activity successful.  Patient will identify how skills used during activity can be applied to reach post d/c goals.   Behavioral Response: Engaged  Intervention: STEM Activity- Glass Blower/designer  Activity: Tallest [employer]. In teams of 5-6, patients were given 11 craft pipe cleaners. Using the materials provided, patients were instructed to compete again the opposing team(s) to build the tallest free-standing structure from floor level. The activity was timed; difficulty increased by clinical research associate as production designer, theatre/television/film continued.  Systematically resources were removed with additional directions for example, placing one arm behind their back, working in silence, and shape stipulations. LRT facilitated post-activity discussion reviewing team processes and necessary communication skills involved in completion. Patients were encouraged to reflect how the skills utilized, or not utilized, in this activity can be incorporated to positively impact support systems post discharge.  Education: Pharmacist, Community, Scientist, Physiological, Discharge Planning   Education Outcome: Acknowledges education/In group clarification offered/Needs additional education.    Affect/Mood: Appropriate   Participation Level: Engaged   Participation Quality: Independent   Behavior: Appropriate   Speech/Thought Process: Focused   Insight: Good   Judgement: Good   Modes of Intervention: STEM Activity   Patient Response to Interventions:  Engaged   Education Outcome:  In group clarification offered    Clinical  Observations/Individualized Feedback: Pt was focused and engaged. Pt was bright and skeptical as well. Pt was able to work through her skepticism to work with peers in completing the structure. Pt was engaged throughout activity.     Plan: Continue to engage patient in RT group sessions 2-3x/week.   Lavinia Mcneely-McCall, LRT,CTRS 01/07/2025 12:34 PM

## 2025-01-07 NOTE — Progress Notes (Signed)
" °   01/07/25 1250  Psych Admission Type (Psych Patients Only)  Admission Status Voluntary/72 hour document signed  Date 72 hour document signed  01/07/25  Time 72 hour document signed  1250  Provider Notified (First and Last Name) (see details for LINK to note) Christal, NP provided verbal order to have patient sign in as voluntary and sign a 72-hour request for discharge form    "

## 2025-01-07 NOTE — BHH Counselor (Signed)
 Adult Comprehensive Assessment  Patient ID: Brenda Bryan, female   DOB: Aug 10, 1976, 49 y.o.   MRN: [id number]  Information Source: Information source: Patient  Current Stressors:  Patient states their primary concerns and needs for treatment are:: Poor judgment call. I drank and then I cut my wrists, I don't think the goal was to take my life. Patient denies SI, HI, and AVH. Patient states their goals for this hospitilization and ongoing recovery are:: To get with an outpatient psychiatrist and therapist Educational / Learning stressors: None reported Employment / Job issues: Patient missed two job interviews while hospitalized, patient was having difficulty finding a job in [place] where she was previously living. Family Relationships: None reported Financial / Lack of resources (include bankruptcy): Patient is experiencing a lack of finances Housing / Lack of housing: Patient is currently living with her 20yo son Physical health (include injuries & life threatening diseases): None reported Social relationships: I don't have a large circle of friends because I don't trust a lot of people patient was living in [place] with her boyfriend for 1.5 years but recently had a break up which led to her move to [place] Substance abuse: Increased drinking the last 3 weeks but was drinking daily before that, patient states the drinking has increased in the last two years. Bereavement / Loss: None reported  Living/Environment/Situation:  Living Arrangements: Children Living conditions (as described by patient or guardian): They're good, clean Who else lives in the home?: Patient's son, son's gf, and roommate How long has patient lived in current situation?: 3 weeks What is atmosphere in current home: Comfortable, Paramedic, Supportive, Temporary  Family History:  Marital status: Divorced Divorced, when?: 2013 What types of issues is patient dealing with in the relationship?: Just  wasn't happy anymore Are you sexually active?: No What is your sexual orientation?: Heterosexual Has your sexual activity been affected by drugs, alcohol, medication, or emotional stress?: None reported Does patient have children?: Yes How many children?: 2 How is patient's relationship with their children?: 20 and 25 They are my support system, we know how much we mean to each other  Childhood History:  By whom was/is the patient raised?: Both parents Additional childhood history information: Parents in the same home Description of patient's relationship with caregiver when they were a child: They were my friends, they weren't really my parents. It wasn't the greatest. Patient's description of current relationship with people who raised him/her: My mom is deceased, she died when my son was 28 weeks old. I keep in touch with my dad, he lives in [place]  How were you disciplined when you got in trouble as a child/adolescent?: We didn't really get disciplined Does patient have siblings?: Yes Number of Siblings: 1 Description of patient's current relationship with siblings: Sister Non existent relationship Did patient suffer any verbal/emotional/physical/sexual abuse as a child?: No Did patient suffer from severe childhood neglect?: No Has patient ever been sexually abused/assaulted/raped as an adolescent or adult?: No Was the patient ever a victim of a crime or a disaster?: No Witnessed domestic violence?: No Has patient been affected by domestic violence as an adult?: No  Education:  Highest grade of school patient has completed: High school diploma Currently a student?: No Learning disability?: No  Employment/Work Situation:   Employment Situation: Unemployed Patient's Job has Been Impacted by Current Illness: No What is the Longest Time Patient has Held a Job?: 8 years 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?:  No  Financial Resources:   Financial resources: Support from parents / caregiver, Medicaid Does patient have a representative payee or guardian?: No  Alcohol/Substance Abuse:   What has been your use of drugs/alcohol within the last 12 months?: Increased drinking the last 3 weeks but was drinking daily before that, patient states the drinking has increased in the last two years. Patient reports drinking approx a bottle of wine or an unknown amount of liquor. If attempted suicide, did drugs/alcohol play a role in this?: Yes Alcohol/Substance Abuse Treatment Hx: Denies past history Is patient motivated for change?: Yes Does patient live in an environment that promotes recovery or serves as an obstacle to recovery?: Yes - promotes recovery Describe how the environment promotes recovery or serves as an obstacle to recovery: Patient states son is very supportive Are others in the home using alcohol or other substances?: No Are significant others in the home willing to participate in the patient's care?: Yes Describe significant others willing to participate in the patient's care: Patient denies alcohol or substance abuse in the home, reports supportive home environment. Has alcohol/substance abuse ever caused legal problems?: No  Social Support System:   Patient's Community Support System: Good Describe Community Support System: They're doing all they can, they just want their mom back Type of faith/religion: Sherlean How does patient's faith help to cope with current illness?: Yes, I pray to God all the time  Leisure/Recreation:   Do You Have Hobbies?: Yes Leisure and Hobbies: Shopping, baseball, vacation with my daughter  Strengths/Needs:   What is the patient's perception of their strengths?: I'm a really hard worker, I'm reliable, self sufficient, very clean and neat, selfless. Patient states they can use these personal strengths during their treatment to contribute to their  recovery: Focus on doing those things instead of drinking Patient states these barriers may affect/interfere with their treatment: None reported Patient states these barriers may affect their return to the community: None reported  Discharge Plan:   Currently receiving community mental health services: No Patient states concerns and preferences for aftercare planning are: Outpatient therapy + psychiatry / SAIOP Patient states they will know when they are safe and ready for discharge when: I think I'm ready now. I know moving forward what I have to do Does patient have access to transportation?: Yes Does patient have financial barriers related to discharge medications?: No Will patient be returning to same living situation after discharge?: Yes  Summary/Recommendations:   Summary and Recommendations (to be completed by the evaluator): Brenda Bryan is a 49yo female involuntarily admitted to [facility] secondary to [facility] due to a suicide attempt where patient cut wrist while under the influence. Patient stated it was a poor judgment call. I drank and then I cut my wrists, I don't think the goal was to take my life.  Patient denies SI, HI, and AVH. Patient identified her main stressors as a lack of finances, lack of employment, and a lack of stable housing. Patient was in a relationship for two years but recently experienced a break up which led to her move into her son's house in [place]. Patient states that she missed two job interviews since being hospitalized which has contributed to stress. Patient endorses daily alcohol use for the last two years but states the alcohol intake has increased in the last three weeks, patient reports drinking approx a bottle of wine or an unknown amount of liquor. Patient denies substance use or hx  of alcohol/substance use tx. UDS negative for all substances (per [facility] chart). Patient denies legal problems due to alcohol/substances. Patient  declined inpatient services for alcohol abuse treatment. Patient reports a good support system mostly consisting of her children. Patient is not receiving any mental health services but is open to outpatient therapy and MM and potentially SAIOP.  While here, Brenda Bryan can benefit from crisis stabilization, medication management, therapeutic milieu, and referrals for services.   Brenda Bryan. 01/08/2025

## 2025-01-07 NOTE — Plan of Care (Signed)
   Problem: Education: Goal: Knowledge of [place] [facility] Education information/materials will improve Outcome: Progressing Goal: Emotional status will improve Outcome: Progressing Goal: Mental status will improve Outcome: Progressing Goal: Verbalization of understanding the information provided will improve Outcome: Progressing

## 2025-01-07 NOTE — H&P (Signed)
 " Psychiatric Admission Assessment Adult  Patient Identification: Brenda Bryan MRN:  [id number] Date of Evaluation:  01/07/2025 Chief Complaint:  MDD (major depressive disorder), severe (HCC) [F32.2] Principal Diagnosis: MDD (major depressive disorder), severe (HCC) Diagnosis:  Principal Problem:   MDD (major depressive disorder), severe (HCC) Active Problems:   GAD (generalized anxiety disorder)   Alcohol dependence (HCC)   Essential hypertension   Nicotine  dependence  History of Present Illness:  Brenda Bryan is a 49 year old female with a reported history of major depressive disorder, generalized anxiety disorder with panic attacks, and alcohol use disorder. She initially presented to [facility] following an alleged suicidal gesture involving self-inflicted superficial lacerations to her left wrist with scissors in the context of an attempt to end her life. She endorsed worsening depressive symptoms, increased alcohol use, and multiple psychosocial stressors over the preceding three weeks, including a recent breakup with her boyfriend, financial concerns, and recurrent panic attacks. Given the acute safety concerns, after medical clearance, the patient was admitted to the [facility] for safety, stabilization, and further psychiatric evaluation and treatment. She denies any prior psychiatric hospitalizations, suicide attempts, or history of self-harming behaviors.  Medical history is notable for breast cancer, double mastectomy, hypertension, and hysterectomy.   Per IVC Petition: Patient presents for suicidal gesture and ideations including scratching her forearms with scissors in an attempt to take her life.  She reports feeling like a burden to her son.  She recently moved out and is living with her son.  She has multiple stressors in her life and reports reportedly just does not want to go forward with wife, cannot take it any longer... .  At this time I do believe the patient  serves as a risk to herself.  ---------------------------------------------------------------------------------  Per [facility], MD Note: 49 year old female, recently strange from boyfriend, has had to move out, has been living with her son for last 5 days. She feels like she is a burden to her son, does not want to do this anymore, just does not want to go forward with life, cannot take it any longer, therefore she scratched her forearm this evening with scissors. Patient admits this was an attempt to take her life. Patient admits to copious amounts of vodka tonight.  --------------------------------------------------------------------------------- Per Telepsychiatry Assessment: Brenda Bryan is a 49 year old female who presents to [facility] due to worsening depressive symptoms. She reports a recent break-up and now lives with her son, his girlfriend and his roommate. Patient reports having multiple stressors and feeling like she didn't want to deal with it anymore. She states she was over it. She reports earlier today feeling suicidal and reported using scissors to cut herself. She states she was feeling suicidal at the time of the self-injurious behavior.  Patient denies any other attempts to end her life. She reports drinking alcohol daily, last use was today an unknown amount. She reports alternating between drinking wine and liquor (vodka), and reports drinking large amounts. Patient denies any withdrawal symptoms. She denies any other substance use.  Patient reports irritability, crying spells, isolation, anhedonia, hopelessness, worthlessness, and poor sleep. Patient reports she was previously seeing a therapist in [place] bi-weekly but due to the long commute, 1.5 hours, she was unable to continue going. She states she has not seen that therapist in about 1 month. Patient reports she is prescribed Lexapro  and is compliant with this medication. She did not report any recent  medication changes. Patient denies history of abuse or trauma.  She denies legal concerns or access to weapons. She reports she is not employed but denies any financial concerns. Patient denies auditory or visual hallucinations, paranoia, and homicidal  Patient verbally contracts for safety at this time and reports she feels that she would benefit  ---------------------------------------------------------------------------------   Evaluation on Unit: The patient reports recent excessive alcohol use, stating, I had drank way too much. She describes multiple stressors over the past three weeks, including being kicked out of her boyfriends house in [place] and financial difficulties resulting in selling her car. She reports that at her sons house, she made superficial cuts to her left wrist with scissors and sent pictures to her children; her daughter called EMS. The patient reports she was inebriated but alert and voluntarily went with EMS. The patient expresses regret for her actions, stating, I know I made a mistake. She reports daily panic attacks, worsened by thoughts of death or dying, particularly concerning her fathers health. She endorses excessive worry, restlessness, and feeling edgy. She denies current suicidal ideation, intent, or plan, as well as homicidal ideation. She denies psychotic symptoms, PTSD-type symptoms, eating disorders, or history of abuse and trauma.  She reports prior outpatient psychiatric care in [place], with the last psychiatric appointment in November and last therapy session one month ago. She reports no history of inpatient psychiatric treatment, prior suicide attempts, or substance rehabilitation. She reports past medication trial of either buspirone or bupropion, which induced panic attacks. She reports current medications include Lexapro  20 mg twice daily and PRN Xanax 0.25 mg (last taken two weeks ago). She endorses consistent medication adherence and  allergies to adhesive (rash) but denies other medication allergies.  The patient reports daily alcohol use, increased over the past three weeks. Reports she usually drinks one bottle of wine daily but recently consumed vodka. Reports she uses nicotine  via vaping daily. She reports being overwhelmed, not depressed, and struggling with sleep, going up to three days without sleep prior to the incident. She denies manic symptoms but states she always has to stay busy. She expresses a desire for outpatient rehabilitation, resuming employment, and improving her life circumstances. She identifies current stressors including recent relationship dissolution, housing instability, unemployment, financial strain, missed job interviews, chronic anxiety with daily panic attacks, poor sleep, and concern regarding her father's severe medical illness.   Did the patient present with any abnormal findings indicating the need for additional neurological or psychological testing?  No  Total Time spent with patient: 1.5 hours   Past Psychiatric History:  Previous Psychiatric Diagnoses: Generalized anxiety disorder, major depressive disorder Prior Inpatient Treatment: None History of Suicide: Denies prior attempts History of Homicide: Denies Psychiatric Medication History: Prior trial of buspirone or bupropion (induced panic attacks); currently taking Lexapro  20 mg BID; PRN Xanax 0.25 mg Psychiatric Medication Compliance History: Consistent Neuromodulation History: None Current Psychiatrist: Outpatient psychiatric provider in [place] (last seen November 2025);  Current Therapist: Outpatient therapist in [place] (last seen one month ago)   Substance Abuse History: Alcohol: Daily use, increased over past three weeks (1 bottle wine/day, recent vodka intake). Tobacco/Nicotine : Daily vaping. Illicit drugs: Denies. Prescription drug abuse: Denies. Rehabilitation: Denies prior rehab; not interested in  inpatient rehab currently.   Past Medical History: Medical Diagnoses: Hypertension (controlled with Coreg  12.5 mg BID), History of breast cancer with double mastectomy (2014), Heart ablation (2018-2019), Hysterectomy (2015). Home Medications: Coreg  12.5 mg BID. Prior Surgeries/Trauma: Double mastectomy, hysterectomy, heart ablation. Head trauma/concussions/seizures: Denies. Allergies: Adhesive (rash). LMP: Hysterectomy  Primary Care  Provider: Not reported    Family History: Medical: Father--COPD, awaiting double lung transplant; resolved Hepatitis C. Mother--heart disease. Psychiatric: Anxiety and agoraphobia in mother and sister. Psychiatric Medications: Not reported. Completed Suicide: Maternal cousin (via overdose and firearm)  Substance Use Family History: Denies.   Social History:  Childhood/Abuse: Denies history of abuse Marital Status: Divorced 2012 Sexual Orientation: Straight Gender Identity: Female Children: 32 year old son, 65 year old daughter (lives in [place]); relationships good. Employment: Unemployed; last employed at [employer] [place], June 2024 Education: High school diploma Peer Group: Not specified Housing: Lives with son, his girlfriend, and roommate in [place]. Finances: Dependent on son; financial concerns. Legal: Denies Military: Denies Religion: Christian Hobbies: Shopping (limited by finances). Support System: Children    Is the patient at risk to self? Yes.    Has the patient been a risk to self in the past 6 months? No.  Has the patient been a risk to self within the distant past? No.  Is the patient a risk to others? No.  Has the patient been a risk to others in the past 6 months? No.  Has the patient been a risk to others within the distant past? No.   [place]ia Scale:  Flowsheet Row [facility]  C-SSRS RISK CATEGORY No Risk      Alcohol Screening: 1. How  often do you have a drink containing alcohol?: 4 or more times a week 2. How many drinks containing alcohol do you have on a typical day when you are drinking?: 5 or 6 3. How often do you have six or more drinks on one occasion?: Daily or almost daily AUDIT-C Score: 10 4. How often during the last year have you found that you were not able to stop drinking once you had started?: Never 5. How often during the last year have you failed to do what was normally expected from you because of drinking?: Never 6. How often during the last year have you needed a first drink in the morning to get yourself going after a heavy drinking session?: Monthly 7. How often during the last year have you had a feeling of guilt of remorse after drinking?: Monthly 8. How often during the last year have you been unable to remember what happened the night before because you had been drinking?: Weekly 9. Have you or someone else been injured as a result of your drinking?: Yes, during the last year 10. Has a relative or friend or a doctor or another health worker been concerned about your drinking or suggested you cut down?: Yes, during the last year Alcohol Use Disorder Identification Test Final Score (AUDIT): 25 Alcohol Brief Interventions/Follow-up: Alcohol education/Brief advice Substance Abuse History in the last 12 months:  Yes.   Consequences of Substance Abuse: Negative Previous Psychotropic Medications: Yes  Psychological Evaluations: No  Past Medical History:  Past Medical History:  Diagnosis Date   Breast cancer (HCC)     Past Surgical History:  Procedure Laterality Date   LAPAROSCOPIC BILATERAL SALPINGO OOPHERECTOMY     MASTECTOMY Bilateral    Family History:  Family History  Problem Relation Age of Onset   Heart disease Mother    COPD Father    Hepatitis C Father    Tobacco Screening: Tobacco Use History[1]  BH Tobacco Counseling     Are you interested in Tobacco Cessation Medications?  No,  patient refused Counseled patient on smoking cessation:  Refused/Declined practical counseling Reason Tobacco  Screening Not Completed: No value filed.       Social History:  Social History   Substance and Sexual Activity  Alcohol Use Yes   Alcohol/week: 3.0 standard drinks of alcohol   Types: 3 Glasses of wine per week     Social History   Substance and Sexual Activity  Drug Use Never    Additional Social History: Marital status: Divorced Divorced, when?: 2013 What types of issues is patient dealing with in the relationship?: Just wasn't happy anymore Are you sexually active?: No What is your sexual orientation?: Heterosexual Has your sexual activity been affected by drugs, alcohol, medication, or emotional stress?: None reported Does patient have children?: Yes How many children?: 2 How is patient's relationship with their children?: 20 and 25 They are my support system, we know how much we mean to each other         Allergies:  Allergies[2] Lab Results: No results found for this or any previous visit (from the past 48 hours).  Blood Alcohol level:  No results found for: [facility]  Metabolic Disorder Labs:  No results found for: HGBA1C, MPG No results found for: PROLACTIN No results found for: CHOL, TRIG, HDL, CHOLHDL, VLDL, LDLCALC  Current Medications: Current Facility-Administered Medications  Medication Dose Route Frequency Provider Last Rate Last Admin   acetaminophen  (TYLENOL ) tablet 650 mg  650 mg Oral Q6H PRN Coleman, Carolyn H, NP       alum & mag hydroxide-simeth (MAALOX/MYLANTA) 200-200-20 MG/5ML suspension 30 mL  30 mL Oral Q4H PRN Coleman, Carolyn H, NP       carvedilol  (COREG ) tablet 12.5 mg  12.5 mg Oral BID Coleman, Carolyn H, NP   12.5 mg at 01/06/25 2109   chlordiazePOXIDE  (LIBRIUM ) capsule 25 mg  25 mg Oral Q6H PRN Mardy Elveria DEL, NP       chlordiazePOXIDE  (LIBRIUM ) capsule 25 mg  25 mg Oral Letha Mardy Elveria DEL, NP    25 mg at 01/07/25 0818   Followed by   NOREEN ON 01/08/2025] chlordiazePOXIDE  (LIBRIUM ) capsule 25 mg  25 mg Oral Daily Coleman, Carolyn H, NP       haloperidol  (HALDOL ) tablet 5 mg  5 mg Oral TID PRN Mardy Elveria DEL, NP       And   diphenhydrAMINE  (BENADRYL ) capsule 50 mg  50 mg Oral TID PRN Mardy Elveria DEL, NP       haloperidol  lactate (HALDOL ) injection 5 mg  5 mg Intramuscular TID PRN Mardy Elveria DEL, NP       And   diphenhydrAMINE  (BENADRYL ) injection 50 mg  50 mg Intramuscular TID PRN Mardy Elveria DEL, NP       And   LORazepam  (ATIVAN ) injection 2 mg  2 mg Intramuscular TID PRN Mardy Elveria DEL, NP       haloperidol  lactate (HALDOL ) injection 10 mg  10 mg Intramuscular TID PRN Mardy Elveria DEL, NP       And   diphenhydrAMINE  (BENADRYL ) injection 50 mg  50 mg Intramuscular TID PRN Mardy Elveria DEL, NP       And   LORazepam  (ATIVAN ) injection 2 mg  2 mg Intramuscular TID PRN Mardy Elveria DEL, NP       NOREEN ON 01/08/2025] escitalopram  (LEXAPRO ) tablet 20 mg  20 mg Oral Daily Catalena [place] H, NP       hydrOXYzine  (ATARAX ) tablet 50 mg  50 mg Oral Q6H PRN Hanson Medeiros H, NP       loperamide  (IMODIUM )  capsule 2-4 mg  2-4 mg Oral PRN Coleman, Carolyn H, NP       magnesium  hydroxide (MILK OF MAGNESIA) suspension 30 mL  30 mL Oral Daily PRN Coleman, Carolyn H, NP       multivitamin with minerals tablet 1 tablet  1 tablet Oral Daily Mardy Elveria DEL, NP   1 tablet at 01/07/25 0818   naltrexone  (DEPADE) tablet 50 mg  50 mg Oral Daily Esta Carmon H, NP   50 mg at 01/07/25 1301   nicotine  (NICODERM CQ  - dosed in mg/24 hours) patch 21 mg  21 mg Transdermal Daily Coleman, Carolyn H, NP   21 mg at 01/07/25 9180   ondansetron  (ZOFRAN -ODT) disintegrating tablet 4 mg  4 mg Oral Q6H PRN Coleman, Carolyn H, NP       thiamine  (Vitamin B-1) tablet 100 mg  100 mg Oral Daily Coleman, Carolyn H, NP   100 mg at 01/07/25 9180   traZODone  (DESYREL ) tablet 50 mg  50 mg Oral QHS  Deslyn Cavenaugh H, NP       PTA Medications: Medications Prior to Admission  Medication Sig Dispense Refill Last Dose/Taking   ALPRAZolam (XANAX) 0.25 MG tablet Take 0.25 mg by mouth daily.   Past Month   carvedilol  (COREG ) 12.5 MG tablet Take 12.5 mg by mouth 2 (two) times daily.   Past Month   escitalopram  (LEXAPRO ) 20 MG tablet Take 20 mg by mouth 2 (two) times daily.   Past Month    AIMS:  ,  ,  ,  ,  ,  ,    Musculoskeletal: Strength & Muscle Tone: within normal limits Gait & Station: normal Patient leans: N/A    Psychiatric Specialty Exam:  Presentation  General Appearance: Appropriate for Environment (Wearing hospital scrubs and gown)  Eye Contact:Good  Speech:Clear and Coherent; Normal Rate  Speech Volume:Normal  Handedness:No data recorded  Mood and Affect  Mood:Anxious; Euthymic  Affect:Congruent   Thought Process  Thought Processes:Coherent; Goal Directed  Duration of Psychotic Symptoms:N/A Past Diagnosis of Schizophrenia or Psychoactive disorder: No data recorded Descriptions of Associations:Intact  Orientation:Full (Time, Place and Person)  Thought Content:Logical  Hallucinations:Hallucinations: None  Ideas of Reference:None  Suicidal Thoughts:Suicidal Thoughts: No  Homicidal Thoughts:Homicidal Thoughts: No   Sensorium  Memory:Immediate Good; Recent Good; Remote Good  Judgment:Poor  Insight:Fair   Executive Functions  Concentration:Good  Attention Span:Good  Recall:Good  Fund of Knowledge:Good  Language:Good   Psychomotor Activity  Psychomotor Activity:Psychomotor Activity: Normal   Assets  Assets:Desire for Improvement; Housing; Resilience; Social Support; Talents/Skills   Sleep  Sleep:Sleep: Poor  Estimated Sleeping Duration (Last 24 Hours): 5.25-6.25 hours   Physical Exam: Physical Exam Vitals and nursing note reviewed.  Constitutional:      General: She is not in acute distress.    Appearance: She is  not ill-appearing.  HENT:     Head: Normocephalic and atraumatic.     Mouth/Throat:     Pharynx: Oropharynx is clear.  Pulmonary:     Effort: No respiratory distress.  Musculoskeletal:        General: Normal range of motion.  Skin:    Comments: Superficial laceration on left inner arm  Neurological:     Mental Status: She is oriented to person, place, and time.    Review of Systems  Psychiatric/Behavioral:  Positive for depression and substance abuse. Negative for hallucinations, memory loss and suicidal ideas. The patient is nervous/anxious and has insomnia.    Blood pressure 119/70, pulse 62,  temperature 97.7 F (36.5 C), temperature source Oral, resp. rate 16, height 5' 5 (1.651 m), weight 66.7 kg, SpO2 100%. Body mass index is 24.46 kg/m.  Assessment:  The patient is an adult female with a history of generalized anxiety disorder and major depressive disorder presenting with acute alcohol intoxication, superficial self-injury, and worsening psychosocial stressors. Although she denies suicidal intent and reports the behavior occurred while intoxicated, the presentation reflects impaired judgment, escalating alcohol use, significant anxiety, daily panic attacks, and insomnia. She denies current suicidal or homicidal ideation and demonstrates future-oriented thinking with expressed desire for outpatient treatment, employment, and improved functioning.  The case was reviewed with the attending psychiatrist. The plan includes decreasing Lexapro  to 30 mg daily due to high dosing, with an immediate 10 mg dose administered today to total 30 mg. Trazodone  will be scheduled nightly. Hydroxyzine  PRN will be increased to 50 mg. Naltrexone  50 mg daily will be initiated with patient consent for alcohol use disorder. Coreg  parameters are in place due to hypotension. CIWA protocol and Librium  taper will be continued. External lab review shows elevated AST 57 and ALT 46; CMP will be repeated, and TSH,  hemoglobin Alc, lipid panel, and vitamin D levels are ordered for the morning.  PDMP review shows last Xanax fill 11/04/24 for 15 tablets, no refills.     Treatment Plan Summary: Daily contact with patient to assess and evaluate symptoms and progress in treatment and Medication management  Diagnoses / Active Problems:  Principal Problem:   MDD (major depressive disorder), severe (HCC) Active Problems:   GAD (generalized anxiety disorder)   Alcohol dependence (HCC)   Essential hypertension   Nicotine  dependence                 PLAN: Safety and Monitoring: -- Involuntary (will not uphold) admission to inpatient psychiatric unit for safety, stabilization and treatment -- Daily contact with patient to assess and evaluate symptoms and progress in treatment -- Patient's case to be discussed in multi-disciplinary team meeting -- Observation Level: q15 minute checks -- Vital signs:  q12 hours -- Precautions: suicide, elopement, and assault   2. Psychiatric Treatment:  -- Decrease Lexapro  to 30 mg oral daily for depression and anxiety -- Lexapro  10 mg oral immediate dose to equal 30 mg today (2/4) -- Start naltrexone  50 mg oral daily for alcohol use disorder -- Increase Hydroxyzine  to 50 mg oral, 3 times daily as needed, anxiety -- Reschedule Trazodone  50 mg, oral, to daily at bedtime, sleep -- Haldol  BH Agitation Protocol (See MAR)                   3. Medical Issues Being Addressed:          # Nicotine  Dependence  -- Nicotine  21 mg patch daily  -- Nicotine  cessation encouraged      # HTN -- Continue Coreg  12.5 mg oral 2 times daily (Hold if SBP <=90, DBP <=60, HR <=60).       # Alcohol use disorder -- Continue CIWA protocol  -- Continue Librium  detox taper -- Continue multivitamin with minerals 1 tablet oral daily -- Continue vitamin B-1 tablet 100 mg oral daily -- Continue Zofran  ODT tablet 4 mg every 6 hours as needed nausea/vomiting, for 72 hours -- Continue Imodium   capsule 2 to 4 mg as needed for loose stools, for 72 hours   4.  External labs reviewed from 01/04/2025  Salicylates: < 1.0 Urine Oplates Screen: NEG Urine Methadone Screen: NEG Acetaminophen  <  10.0 Ur Barbiturates Screen NEG Ur Amphetamines Screen NEG U Benzodiazepines Scr NEG Urine [place] Screen NEG U Marijuana (THC) Screen NEG Plasma/Serum Ethyl Alc 0.36 H   Sodium 144   Potassium 3.8   Chloride 107   Carbon Dioxide 23   Anion Gap 18 H   SUN 11   Creatinine 0.70   GFR Calculation 106   Glucose 99   Calculated Osmolality 276   Calcium 7.9 L   Prot Corrected Calcium 8.1 L   Magnesium   2.00  Total [place] 0.5   AST 57 H   ALT 46 H   Alkaline Phosphatase 85   Total Protein 1. 9   Albumin 3.8     WHITE BLOOD COUNT 3.7  RED BLOOD COUNT 3. 84  HEMOGLOBIN 13.4  HEMATOCRIT 39.3  MCV 102  MCH 34.9  MCHC 34.1  RDW 16.2  PLATELET COUNT 305  MPV 6. 3  NEUTR (AUTO) 39.0  LYMPH (AUTO) 49. 3  MONO (AUTO) 7.6  EOS (AUTO) 2. 5  BASO (AUTO) 1.6  ANC (ABS NEUT CT) 1.44  ALC (ABS LYMPH CT) 1.81    -- QRS: 118 QTc: 473  New labs ordered: TSH, hemoglobin A1c, lipid panel, vitamin D, CMP (recheck AST and ALT)    -- The risks/benefits/side-effects/alternatives to this medication were discussed in detail with the patient and time was given for questions. The patient consents to medication trial.  -- FDA -- Metabolic profile and EKG monitoring obtained while on an atypical antipsychotic (BMI: Lipid Panel: HbgA1c: QTc:)  -- Encouraged patient to participate in unit milieu and in scheduled group therapies  -- Short Term Goals: Ability to identify changes in lifestyle to reduce recurrence of condition will improve, Ability to verbalize feelings will improve, Ability to disclose and discuss suicidal ideas, Ability to demonstrate self-control will improve, Ability to identify and develop effective coping behaviors will improve, Ability to maintain clinical measurements within  normal limits will improve, Compliance with prescribed medications will improve, and Ability to identify triggers associated with substance abuse/mental health issues will improve -- Long Term Goals: Improvement in symptoms so as ready for discharge     5. Discharge Planning:  -- Social work and case management to assist with discharge planning and identification of hospital follow-up needs prior to discharge -- Estimated LOS: 2-3 days -- Discharge Concerns: Need to establish a safety plan; Medication compliance and effectiveness -- Discharge Goals: Return home with outpatient referrals for mental health follow-up including medication management/psychotherapy     Physician Treatment Plan for Primary Diagnosis: MDD (major depressive disorder), severe (HCC) Long Term Goal(s): Improvement in symptoms so as ready for discharge  Short Term Goals: Ability to identify changes in lifestyle to reduce recurrence of condition will improve, Ability to verbalize feelings will improve, Ability to disclose and discuss suicidal ideas, Ability to demonstrate self-control will improve, Ability to identify and develop effective coping behaviors will improve, Ability to maintain clinical measurements within normal limits will improve, Compliance with prescribed medications will improve, and Ability to identify triggers associated with substance abuse/mental health issues will improve   I certify that inpatient services furnished can reasonably be expected to improve the patient's condition.    Blair Chiquita Hint, NP 2/4/20264:00 PM      [1]  Social History Tobacco Use  Smoking Status Every Day   Current packs/day: 0.50   Types: Cigarettes  Smokeless Tobacco Never  Tobacco Comments   Vapes (nicotine ) 2 cartridges/ week   [2] No Known  Allergies  "

## 2025-01-07 NOTE — BH IP Treatment Plan (Signed)
 Interdisciplinary Treatment and Diagnostic Plan Update  01/07/2025 Time of Session: 1000 Brenda Bryan MRN: [id number]  Principal Diagnosis: MDD (major depressive disorder), severe (HCC)  Secondary Diagnoses: Principal Problem:   MDD (major depressive disorder), severe (HCC)   Current Medications:  Current Facility-Administered Medications  Medication Dose Route Frequency Provider Last Rate Last Admin   acetaminophen  (TYLENOL ) tablet 650 mg  650 mg Oral Q6H PRN Coleman, Carolyn H, NP       alum & mag hydroxide-simeth (MAALOX/MYLANTA) 200-200-20 MG/5ML suspension 30 mL  30 mL Oral Q4H PRN Coleman, Carolyn H, NP       carvedilol  (COREG ) tablet 12.5 mg  12.5 mg Oral BID Coleman, Carolyn H, NP   12.5 mg at 01/06/25 2109   chlordiazePOXIDE  (LIBRIUM ) capsule 25 mg  25 mg Oral Q6H PRN Mardy Elveria DEL, NP       chlordiazePOXIDE  (LIBRIUM ) capsule 25 mg  25 mg Oral Letha Mardy Elveria DEL, NP   25 mg at 01/07/25 0818   Followed by   NOREEN ON 01/08/2025] chlordiazePOXIDE  (LIBRIUM ) capsule 25 mg  25 mg Oral Daily Coleman, Carolyn H, NP       haloperidol  (HALDOL ) tablet 5 mg  5 mg Oral TID PRN Mardy Elveria DEL, NP       And   diphenhydrAMINE  (BENADRYL ) capsule 50 mg  50 mg Oral TID PRN Mardy Elveria DEL, NP       haloperidol  lactate (HALDOL ) injection 5 mg  5 mg Intramuscular TID PRN Mardy Elveria DEL, NP       And   diphenhydrAMINE  (BENADRYL ) injection 50 mg  50 mg Intramuscular TID PRN Mardy Elveria DEL, NP       And   LORazepam  (ATIVAN ) injection 2 mg  2 mg Intramuscular TID PRN Coleman, Carolyn H, NP       haloperidol  lactate (HALDOL ) injection 10 mg  10 mg Intramuscular TID PRN Mardy Elveria DEL, NP       And   diphenhydrAMINE  (BENADRYL ) injection 50 mg  50 mg Intramuscular TID PRN Mardy Elveria DEL, NP       And   LORazepam  (ATIVAN ) injection 2 mg  2 mg Intramuscular TID PRN Coleman, Carolyn H, NP       escitalopram  (LEXAPRO ) tablet 20 mg  20 mg Oral BID Coleman, Carolyn H, NP    20 mg at 01/07/25 0818   hydrOXYzine  (ATARAX ) tablet 25 mg  25 mg Oral Q6H PRN Coleman, Carolyn H, NP       loperamide  (IMODIUM ) capsule 2-4 mg  2-4 mg Oral PRN Mardy Elveria DEL, NP       magnesium  hydroxide (MILK OF MAGNESIA) suspension 30 mL  30 mL Oral Daily PRN Mardy Elveria DEL, NP       multivitamin with minerals tablet 1 tablet  1 tablet Oral Daily Mardy Elveria DEL, NP   1 tablet at 01/07/25 0818   nicotine  (NICODERM CQ  - dosed in mg/24 hours) patch 21 mg  21 mg Transdermal Daily Mardy Elveria DEL, NP   21 mg at 01/07/25 9180   ondansetron  (ZOFRAN -ODT) disintegrating tablet 4 mg  4 mg Oral Q6H PRN Coleman, Carolyn H, NP       thiamine  (Vitamin B-1) tablet 100 mg  100 mg Oral Daily Coleman, Carolyn H, NP   100 mg at 01/07/25 9180   traZODone  (DESYREL ) tablet 50 mg  50 mg Oral QHS PRN Coleman, Carolyn H, NP   50 mg at 01/06/25 2109   PTA Medications: Medications Prior  to Admission  Medication Sig Dispense Refill Last Dose/Taking   ALPRAZolam (XANAX) 0.25 MG tablet Take 0.25 mg by mouth daily.   Past Month   carvedilol  (COREG ) 12.5 MG tablet Take 12.5 mg by mouth 2 (two) times daily.   Past Month   escitalopram  (LEXAPRO ) 20 MG tablet Take 20 mg by mouth 2 (two) times daily.   Past Month    Patient Stressors: Marital or family conflict   Substance abuse    Patient Strengths: Manufacturing systems engineer  Supportive family/friends   Treatment Modalities: Medication Management, Group therapy, Case management,  1 to 1 session with clinician, Psychoeducation, Recreational therapy.   Physician Treatment Plan for Primary Diagnosis: MDD (major depressive disorder), severe (HCC) Long Term Goal(s):     Short Term Goals:    Medication Management: Evaluate patient's response, side effects, and tolerance of medication regimen.  Therapeutic Interventions: 1 to 1 sessions, Unit Group sessions and Medication administration.  Evaluation of Outcomes: Not Progressing  Physician Treatment Plan  for Secondary Diagnosis: Principal Problem:   MDD (major depressive disorder), severe (HCC)  Long Term Goal(s):     Short Term Goals:       Medication Management: Evaluate patient's response, side effects, and tolerance of medication regimen.  Therapeutic Interventions: 1 to 1 sessions, Unit Group sessions and Medication administration.  Evaluation of Outcomes: Not Progressing   RN Treatment Plan for Primary Diagnosis: MDD (major depressive disorder), severe (HCC) Long Term Goal(s): Knowledge of disease and therapeutic regimen to maintain health will improve  Short Term Goals: Ability to remain free from injury will improve, Ability to verbalize frustration and anger appropriately will improve, Ability to demonstrate self-control, Ability to participate in decision making will improve, Ability to verbalize feelings will improve, Ability to disclose and discuss suicidal ideas, Ability to identify and develop effective coping behaviors will improve, and Compliance with prescribed medications will improve  Medication Management: RN will administer medications as ordered by provider, will assess and evaluate patient's response and provide education to patient for prescribed medication. RN will report any adverse and/or side effects to prescribing provider.  Therapeutic Interventions: 1 on 1 counseling sessions, Psychoeducation, Medication administration, Evaluate responses to treatment, Monitor vital signs and CBGs as ordered, Perform/monitor CIWA, COWS, AIMS and Fall Risk screenings as ordered, Perform wound care treatments as ordered.  Evaluation of Outcomes: Not Progressing   LCSW Treatment Plan for Primary Diagnosis: MDD (major depressive disorder), severe (HCC) Long Term Goal(s): Safe transition to appropriate next level of care at discharge, Engage patient in therapeutic group addressing interpersonal concerns.  Short Term Goals: Engage patient in aftercare planning with referrals and  resources, Increase social support, Increase ability to appropriately verbalize feelings, Increase emotional regulation, Facilitate acceptance of mental health diagnosis and concerns, Facilitate patient progression through stages of change regarding substance use diagnoses and concerns, Identify triggers associated with mental health/substance abuse issues, and Increase skills for wellness and recovery  Therapeutic Interventions: Assess for all discharge needs, 1 to 1 time with Social worker, Explore available resources and support systems, Assess for adequacy in community support network, Educate family and significant other(s) on suicide prevention, Complete Psychosocial Assessment, Interpersonal group therapy.  Evaluation of Outcomes: Not Progressing   Progress in Treatment: Attending groups: No. Participating in groups: No. Taking medication as prescribed: Yes. Toleration medication: Yes. Family/Significant other contact made: No, will contact:  Pending consents. Patient understands diagnosis: Yes. Discussing patient identified problems/goals with staff: Yes. Medical problems stabilized or resolved: No. Denies suicidal/homicidal ideation: Yes.  Issues/concerns per patient self-inventory: No.  New problem(s) identified: No, Describe:  None  New Short Term/Long Term Goal(s): detox, medication stabilization, elimination of SI thoughts, development of comprehensive mental wellness plan.    Patient Goals:  Pt would like to stop drinking, improve coping for anxiety and address medication issues.  Discharge Plan or Barriers:  Patient recently admitted. CSW will continue to follow and assess for appropriate referrals and possible discharge planning.    Reason for Continuation of Hospitalization: Anxiety Medication stabilization Suicidal ideation Withdrawal symptoms  Estimated Length of Stay: 5-7 days  Last 3 [place] Suicide Severity Risk Score: Flowsheet Row Ad[facility] (Current[facility]  [facility]  C-SSRS RISK CATEGORY No Risk    Last PHQ 2/9 Scores:     No data to display          Scribe for Treatment Team: Derick JONELLE Blanch, LCSW 01/07/2025 11:50 AM

## 2025-01-07 NOTE — Group Note (Signed)
 Date:  01/07/2025 Time:  9:25 AM  Group Topic/Focus:  Goals Group:   The focus of this group is to help patients establish daily goals to achieve during treatment and discuss how the patient can incorporate goal setting into their daily lives to aide in recovery.    Participation Level:  Did not attend  Participation Quality:    Affect:    Cognitive:    Insight:   Engagement in Group:    Modes of Intervention:    Additional Comments:    Brenda Bryan 01/07/2025, 9:25 AM

## 2025-01-08 LAB — COMPREHENSIVE METABOLIC PANEL WITH GFR
ALT: 20 U/L (ref 0–44)
AST: 25 U/L (ref 15–41)
Albumin: 3.8 g/dL (ref 3.5–5.0)
Alkaline Phosphatase: 81 U/L (ref 38–126)
Anion gap: 10 (ref 5–15)
BUN: 9 mg/dL (ref 6–20)
CO2: 27 mmol/L (ref 22–32)
Calcium: 9.3 mg/dL (ref 8.9–10.3)
Chloride: 103 mmol/L (ref 98–111)
Creatinine, Ser: 0.73 mg/dL (ref 0.44–1.00)
GFR, Estimated: >60 mL/min
Glucose, Bld: 86 mg/dL (ref 70–99)
Potassium: 4 mmol/L (ref 3.5–5.1)
Sodium: 140 mmol/L (ref 135–145)
Total Bilirubin: 0.4 mg/dL (ref 0.0–1.2)
Total Protein: 6.7 g/dL (ref 6.5–8.1)

## 2025-01-08 LAB — LIPID PANEL
Cholesterol: 190 mg/dL (ref 0–200)
HDL: 86 mg/dL
LDL Cholesterol: 88 mg/dL (ref 0–99)
Total CHOL/HDL Ratio: 2.2 ratio
Triglycerides: 79 mg/dL
VLDL: 16 mg/dL (ref 0–40)

## 2025-01-08 LAB — VITAMIN D 25 HYDROXY (VIT D DEFICIENCY, FRACTURES): Vit D, 25-Hydroxy: 25.1 ng/mL — ABNORMAL LOW (ref 30–100)

## 2025-01-08 LAB — TSH: TSH: 1.75 u[IU]/mL (ref 0.350–4.500)

## 2025-01-08 LAB — HEMOGLOBIN A1C
Hgb A1c MFr Bld: 4.6 % — ABNORMAL LOW (ref 4.8–5.6)
Mean Plasma Glucose: 85.32 mg/dL

## 2025-01-08 NOTE — Plan of Care (Signed)
   Problem: Education: Goal: Emotional status will improve Outcome: Progressing Goal: Mental status will improve Outcome: Progressing

## 2025-01-08 NOTE — Progress Notes (Signed)
" °   01/08/25 1000  Psych Admission Type (Psych Patients Only)  Admission Status Voluntary/72 hour document signed  Psychosocial Assessment  Patient Complaints Anxiety  Eye Contact Fair  Facial Expression Flat  Affect Irritable  Speech Logical/coherent  Interaction Minimal  Motor Activity Slow  Appearance/Hygiene Unremarkable  Behavior Characteristics Cooperative  Mood Anxious  Thought Process  Coherency WDL  Content WDL  Delusions None reported or observed  Perception WDL  Hallucination None reported or observed  Judgment Impaired  Confusion None  Danger to Self  Current suicidal ideation? Denies  Agreement Not to Harm Self Yes  Description of Agreement Verbal  Danger to Others  Danger to Others None reported or observed    "

## 2025-01-08 NOTE — [facility] Group Notes (Addendum)
 Patient did attend Music Therapy Group.

## 2025-01-08 NOTE — Group Note (Signed)
 Recreation Therapy Group Note   Group Topic:Other  Group Date: 01/08/2025 Start Time: 1303 End Time: 1347 Facilitators: Tavarus Poteete-McCall, LRT,CTRS Location: [address]   Activity Description/Intervention: Therapeutic Drumming. Patients with peers and staff were given the opportunity to engage in a leader facilitated [facility] Empowerment Drumming Circle with staff from the [employer], in partnership with [employer]. Teaching laboratory technician and trained [place], Norleen Mon leading with LRT observing and documenting intervention and pt response. This evidenced-based practice targets 7 areas of health and wellbeing in the human experience including: stress-reduction, exercise, self-expression, camaraderie/support, nurturing, spirituality, and music-making (leisure).   Goal Area(s) Addresses:  Patient will engage in pro-social way in music group.  Patient will follow directions of drum leader on the first prompt. Patient will demonstrate no behavioral issues during group.  Patient will identify if a reduction in stress level occurs as a result of participation in therapeutic drum circle.    Education: Leisure exposure, Pharmacologist, Musical expression, Discharge Planning   Affect/Mood: Appropriate   Participation Level: Engaged   Participation Quality: Independent   Behavior: Appropriate   Speech/Thought Process: Focused   Insight: Good   Judgement: Good   Modes of Intervention: Teaching Laboratory Technician   Patient Response to Interventions:  Engaged   Education Outcome:  In group clarification offered    Clinical Observations/Individualized Feedback: actively engaged in therapeutic drumming exercise and discussions. Pt was appropriate with peers, staff, and musical equipment for duration of programming.  Pt identified relaxed as their feeling after participation in music-based programming. Pt affect congruent/incongruent with  verbalized emotion.     Plan: Continue to engage patient in RT group sessions 2-3x/week.   Fredrico Beedle-McCall, LRT,CTRS  01/08/2025 2:39 PM

## 2025-01-08 NOTE — Group Note (Deleted)
 Date:  01/08/2025 Time:  2:37 PM  Group Topic/Focus: Drumming Group      Participation Level:  {B[facility] PARTICIPATION OZCZO:77735}  Participation Quality:  {[facility] PARTICIPATION QUALITY:22265}  Affect:  {B[facility] AFFECT:22266}  Cognitive:  {[facility] COGNITIVE:22267}  Insight: {BHH Insight2:20797}  Engagement in Group:  {BHH ENGAGEMENT IN HMNLE:77731}  Modes of Intervention:  {BHH MODES OF INTERVENTION:22269}  Additional Comments:  PIERRETTE Seashore D Emeli Goguen 01/08/2025, 2:37 PM

## 2025-01-08 NOTE — [facility] Group Notes (Signed)
 Adult Psychoeducational Group Note  Date:  01/08/2025 Time:  8:46 PM  Group Topic/Focus:  Wrap-Up Group:   The focus of this group is to help patients review their daily goal of treatment and discuss progress on daily workbooks.  Participation Level:  Active  Participation Quality:  Appropriate  Affect:  Appropriate  Cognitive:  Appropriate  Insight: Appropriate  Engagement in Group:  Engaged  Modes of Intervention:  Discussion  Additional Comments:  The engqagement [facility] Coping skills  Lang Donia Law 01/08/2025, 8:46 PM

## 2025-01-08 NOTE — Progress Notes (Signed)
 [facility] MD Progress Note  01/08/2025 4:36 PM Brenda Bryan  MRN:  [id number]  Reason for admission: 49 year old female with a reported history of major depressive disorder, generalized anxiety disorder with panic attacks, and alcohol use disorder. She initially presented to [facility] following an alleged suicidal gesture involving self-inflicted superficial lacerations to her left wrist with scissors in the context of an attempt to end her life. She endorsed worsening depressive symptoms, increased alcohol use, and multiple psychosocial stressors over the preceding three weeks, including a recent breakup with her boyfriend, financial concerns, and recurrent panic attacks. Given the acute safety concerns, after medical clearance, the patient was admitted to the [facility] for safety, stabilization, and further psychiatric evaluation and treatment. She denies any prior psychiatric hospitalizations, suicide attempts, or history of self-harming behaviors. Medical history is notable for breast cancer, double mastectomy, hypertension, and hysterectomy.   Daily notes: Tonna is seen this morning. Chart reviewed. The chart findings discussed with the team this afternoon. Aubrei presents alert, oriented & aware of situation. She is visible on the unit, attending & participating in the group sessions. She presents with an improving affect, good eye contact & verbally responsive. She reports, I came here because I tried to commit self-harm. I'm feeling a lot better now. Actually I'm feeling great. I'm doing well on my medicines as well. There are no side effects that I have noticed as of yet. I slept well last night, but I took Trazodone  & it helped. At this point of me being here, I feel like I can & will benefit from an outpatient counseling sessions that is focused on my real problem & needs. I'm not benefiting from having to participate in a group session based on substance abuse problems when I'm not  abusing substances or a session for people with anxiety issues & I have to be in that session when that was not what I need. However, coming out & being in the day room gives me an opportunity to socialize & talk to other patients. I feel like I'm ready for discharge. I have two children that love me so much that will do all it takes to get me the help I need. It would be great if I can leave tomorrow. Payzlee currently denies any SIHI, AVH, delusional thoughts or paranoia. She does not appear to be responding to any internal stimuli. She denies any new issues or concerns. Patient is likely to be discharged in the morning. Her vital signs remain stable. Reviewed current lab results, stable.  Principal Problem: MDD (major depressive disorder), severe (HCC)  Diagnosis: Principal Problem:   MDD (major depressive disorder), severe (HCC) Active Problems:   GAD (generalized anxiety disorder)   Alcohol dependence (HCC)   Essential hypertension   Nicotine  dependence  Total Time spent with patient: 35 minutes.  Past Psychiatric History: See H&P.  Past Medical History:  Past Medical History:  Diagnosis Date   Breast cancer [facility])     Past Surgical History:  Procedure Laterality Date   LAPAROSCOPIC BILATERAL SALPINGO OOPHERECTOMY     MASTECTOMY Bilateral    Family History:  Family History  Problem Relation Age of Onset   Heart disease Mother    COPD Father    Hepatitis C Father    Family Psychiatric  History: See H&P.SABRA  Social History:  Social History   Substance and Sexual Activity  Alcohol Use Yes   Alcohol/week: 3.0 standard drinks of alcohol   Types: 3 Glasses of wine  per week     Social History   Substance and Sexual Activity  Drug Use Never    Social History   Socioeconomic History   Marital status: Married    Spouse name: Not on file   Number of children: Not on file   Years of education: Not on file   Highest education level: Not on file  Occupational History   Not  on file  Tobacco Use   Smoking status: Every Day    Current packs/day: 0.50    Types: Cigarettes   Smokeless tobacco: Never   Tobacco comments:    Vapes (nicotine ) 2 cartridges/ week   Substance and Sexual Activity   Alcohol use: Yes    Alcohol/week: 3.0 standard drinks of alcohol    Types: 3 Glasses of wine per week   Drug use: Never   Sexual activity: Not on file  Other Topics Concern   Not on file  Social History Narrative   Not on file   Social Drivers of Health   Tobacco Use: High Risk (01/06/2025)   Patient History    Smoking Tobacco Use: Every Day    Smokeless Tobacco Use: Never    Passive Exposure: Not on file  Financial Resource Strain: Not on file  Food Insecurity: No Food Insecurity (01/06/2025)   Epic    Worried About Programme Researcher, Broadcasting/film/video in the Last Year: Never true    Ran Out of Food in the Last Year: Never true  Transportation Needs: No Transportation Needs (01/06/2025)   Epic    Lack of Transportation (Medical): No    Lack of Transportation (Non-Medical): No  Physical Activity: Not on file  Stress: Not on file  Social Connections: Not on file  Depression (EYV7-0): Not on file  Alcohol Screen: High Risk (01/06/2025)   Alcohol Screen    Last Alcohol Screening Score (AUDIT): 25  Housing: Low Risk (01/06/2025)   Epic    Unable to Pay for Housing in the Last Year: No    Number of Times Moved in the Last Year: 1    Homeless in the Last Year: No  Utilities: Not At Risk (01/06/2025)   Epic    Threatened with loss of utilities: No  Health Literacy: Not on file   Additional Social History:   Sleep: Good Estimated Sleeping Duration (Last 24 Hours): 4.75-6.50 hours  Appetite:  Good  Current Medications: Current Facility-Administered Medications  Medication Dose Route Frequency Provider Last Rate Last Admin   acetaminophen  (TYLENOL ) tablet 650 mg  650 mg Oral Q6H PRN Coleman, Carolyn H, NP       alum & mag hydroxide-simeth (MAALOX/MYLANTA) 200-200-20 MG/5ML  suspension 30 mL  30 mL Oral Q4H PRN Coleman, Carolyn H, NP       carvedilol  (COREG ) tablet 12.5 mg  12.5 mg Oral BID Bennett, Christal H, NP   12.5 mg at 01/08/25 9074   chlordiazePOXIDE  (LIBRIUM ) capsule 25 mg  25 mg Oral Q6H PRN Coleman, Carolyn H, NP       haloperidol  (HALDOL ) tablet 5 mg  5 mg Oral TID PRN Mardy Elveria DEL, NP       And   diphenhydrAMINE  (BENADRYL ) capsule 50 mg  50 mg Oral TID PRN Mardy Elveria DEL, NP       haloperidol  lactate (HALDOL ) injection 5 mg  5 mg Intramuscular TID PRN Mardy Elveria DEL, NP       And   diphenhydrAMINE  (BENADRYL ) injection 50 mg  50 mg Intramuscular TID  PRN Mardy Elveria DEL, NP       And   LORazepam  (ATIVAN ) injection 2 mg  2 mg Intramuscular TID PRN Coleman, Carolyn H, NP       haloperidol  lactate (HALDOL ) injection 10 mg  10 mg Intramuscular TID PRN Mardy Elveria DEL, NP       And   diphenhydrAMINE  (BENADRYL ) injection 50 mg  50 mg Intramuscular TID PRN Mardy Elveria DEL, NP       And   LORazepam  (ATIVAN ) injection 2 mg  2 mg Intramuscular TID PRN Mardy Elveria DEL, NP       escitalopram  (LEXAPRO ) tablet 20 mg  20 mg Oral Daily Bennett, Christal H, NP   20 mg at 01/08/25 9074   hydrOXYzine  (ATARAX ) tablet 50 mg  50 mg Oral Q6H PRN Bennett, Christal H, NP       loperamide  (IMODIUM ) capsule 2-4 mg  2-4 mg Oral PRN Coleman, Carolyn H, NP       magnesium  hydroxide (MILK OF MAGNESIA) suspension 30 mL  30 mL Oral Daily PRN Coleman, Carolyn H, NP       multivitamin with minerals tablet 1 tablet  1 tablet Oral Daily Mardy Elveria DEL, NP   1 tablet at 01/08/25 9074   naltrexone  (DEPADE) tablet 50 mg  50 mg Oral Daily Bennett, Christal H, NP   50 mg at 01/08/25 9074   nicotine  (NICODERM CQ  - dosed in mg/24 hours) patch 21 mg  21 mg Transdermal Daily Coleman, Carolyn H, NP   21 mg at 01/08/25 9074   ondansetron  (ZOFRAN -ODT) disintegrating tablet 4 mg  4 mg Oral Q6H PRN Coleman, Carolyn H, NP       thiamine  (Vitamin B-1) tablet 100 mg  100 mg  Oral Daily Coleman, Carolyn H, NP   100 mg at 01/08/25 9074   traZODone  (DESYREL ) tablet 50 mg  50 mg Oral QHS Bennett, Christal H, NP   50 mg at 01/07/25 2109   Lab Results:  Results for orders placed or performed during the hospital encounter of 01/06/25 (from the past 48 hours)  VITAMIN D 25 Hydroxy (Vit-D Deficiency, Fractures)     Status: Abnormal   Collection Time: 01/08/25  6:32 AM  Result Value Ref Range   Vit D, 25-Hydroxy 25.1 (L) 30 - 100 ng/mL    Comment: (NOTE) Vitamin D deficiency has been defined by the Institute of Medicine  and an Endocrine Society practice guideline as a level of serum 25-OH  vitamin D less than 20 ng/mL (1,2). The Endocrine Society went on to  further define vitamin D insufficiency as a level between 21 and 29  ng/mL (2).  1. IOM (Institute of Medicine). 2010. Dietary reference intakes for  calcium and D. [place]: [employer]. 2. Holick MF, Binkley Sunshine, Bischoff-Ferrari HA, et al. Evaluation,  treatment, and prevention of vitamin D deficiency: an Endocrine  Society clinical practice guideline, JCEM. 2011 Jul; 96(7): 1911-30.  Performed at [facility], [address] [place], [place] [address]   TSH     Status: None   Collection Time: 01/08/25  6:32 AM  Result Value Ref Range   TSH 1.750 0.350 - 4.500 uIU/mL    Comment: Performed at [facility], [address], [place], [place] [address]  Lipid panel     Status: None   Collection Time: 01/08/25  6:32 AM  Result Value Ref Range   Cholesterol 190 0 - 200 mg/dL  Comment:        ATP III CLASSIFICATION:  <200     mg/dL   Desirable  799-760  mg/dL   Borderline High  >=759    mg/dL   High           Triglycerides 79 <150 mg/dL   HDL 86 >59 mg/dL   Total CHOL/HDL Ratio 2.2 RATIO   VLDL 16 0 - 40 mg/dL   LDL Cholesterol 88 0 - 99 mg/dL    Comment:        Total Cholesterol/HDL:CHD Risk Coronary Heart Disease Risk Table                     Men    Women  1/2 Average Risk   3.4   3.3  Average Risk       5.0   4.4  2 X Average Risk   9.6   7.1  3 X Average Risk  23.4   11.0        Use the calculated Patient Ratio above and the CHD Risk Table to determine the patient's CHD Risk.        ATP III CLASSIFICATION (LDL):  <100     mg/dL   Optimal  899-870  mg/dL   Near or Above                    Optimal  130-159  mg/dL   Borderline  839-810  mg/dL   High  >809     mg/dL   Very High Performed at [facility], [address] [place], [place] [address]   Hemoglobin A1c     Status: Abnormal   Collection Time: 01/08/25  6:32 AM  Result Value Ref Range   Hgb A1c MFr Bld 4.6 (L) 4.8 - 5.6 %    Comment: (NOTE) Diagnosis of Diabetes The following HbA1c ranges recommended by the American Diabetes Association (ADA) may be used as an aid in the diagnosis of diabetes mellitus.  Hemoglobin             Suggested A1C NGSP%              Diagnosis  <5.7                   Non Diabetic  5.7-6.4                Pre-Diabetic  >6.4                   Diabetic  <7.0                   Glycemic control for                       adults with diabetes.     Mean Plasma Glucose 85.32 mg/dL    Comment: Performed at [facility], [address], [place], [place] [address]  Comprehensive metabolic panel with GFR     Status: None   Collection Time: 01/08/25  6:32 AM  Result Value Ref Range   Sodium 140 135 - 145 mmol/L   Potassium 4.0 3.5 - 5.1 mmol/L   Chloride 103 98 - 111 mmol/L   CO2 27 22 - 32 mmol/L   Glucose, Bld 86 70 - 99 mg/dL    Comment: Glucose reference range applies only to samples taken after fasting for at least 8 hours.  BUN 9 6 - 20 mg/dL   Creatinine, Ser 9.26 0.44 - 1.00 mg/dL   Calcium 9.3 8.9 - 89.6 mg/dL   Total Protein 6.7 6.5 - 8.1 g/dL   Albumin 3.8 3.5 - 5.0 g/dL   AST 25 15 - 41 U/L   ALT 20 0 - 44 U/L   Alkaline Phosphatase 81 38 - 126 U/L   Total Bilirubin 0.4 0.0 - 1.2 mg/dL   GFR,  Estimated >39 >39 mL/min    Comment: (NOTE) Calculated using the CKD-EPI Creatinine Equation (2021)    Anion gap 10 5 - 15    Comment: Performed at [facility], [address] [place], [place] [address]   Blood Alcohol level:  No results found for: [facility]  Metabolic Disorder Labs: Lab Results  Component Value Date   HGBA1C 4.6 (L) 01/08/2025   MPG 85.32 01/08/2025   No results found for: PROLACTIN Lab Results  Component Value Date   CHOL 190 01/08/2025   TRIG 79 01/08/2025   HDL 86 01/08/2025   CHOLHDL 2.2 01/08/2025   VLDL 16 01/08/2025   LDLCALC 88 01/08/2025   Physical Findings: AIMS:  ,  ,  ,  ,  ,  ,   CIWA:  CIWA-Ar Total: 0 COWS:     Musculoskeletal: Strength & Muscle Tone: within normal limits Gait & Station: normal Patient leans: N/A  Psychiatric Specialty Exam:  Presentation  General Appearance:  Appropriate for Environment (Wearing hospital scrubs and gown)  Eye Contact: Good  Speech: Clear and Coherent; Normal Rate  Speech Volume: Normal  Handedness:No data recorded  Mood and Affect  Mood: Anxious; Euthymic  Affect: Congruent   Thought Process  Thought Processes: Coherent; Goal Directed  Descriptions of Associations:Intact  Orientation:Full (Time, Place and Person)  Thought Content: Logical  History of Schizophrenia/Schizoaffective disorder: NA  Duration of Psychotic Symptoms: NA  Hallucinations:Hallucinations: None  Ideas of Reference:None  Suicidal Thoughts:Suicidal Thoughts: No  Homicidal Thoughts:Homicidal Thoughts: No  Sensorium  Memory: Immediate Good; Recent Good; Remote Good  Judgment: Poor  Insight: Fair  Art Therapist  Concentration: Good  Attention Span: Good  Recall: Good  Fund of Knowledge: Good  Language: Good  Psychomotor Activity  Psychomotor Activity: Psychomotor Activity: Normal  Assets  Assets: Desire for Improvement; Housing; Resilience; Social  Support; Talents/Skills  Sleep  Sleep: Sleep: Good Number of Hours of Sleep: 7.5  Physical Exam: Physical Exam Vitals and nursing note reviewed.  HENT:     Head: Normocephalic.     Nose: Nose normal.     Mouth/Throat:     Pharynx: Oropharynx is clear.  Cardiovascular:     Rate and Rhythm: Normal rate.     Pulses: Normal pulses.  Pulmonary:     Effort: Pulmonary effort is normal.  Genitourinary:    Comments: Deferred. Musculoskeletal:        General: Normal range of motion.     Cervical back: Normal range of motion.  Skin:    General: Skin is dry.  Neurological:     General: No focal deficit present.     Mental Status: She is alert and oriented to person, place, and time.    Review of Systems  Constitutional:  Negative for chills, diaphoresis and fever.  HENT:  Negative for congestion and sore throat.   Respiratory:  Negative for cough, shortness of breath and wheezing.   Cardiovascular:  Negative for chest pain and palpitations.  Gastrointestinal:  Negative for abdominal pain, constipation, diarrhea, heartburn,  nausea and vomiting.  Genitourinary:  Negative for dysuria.  Musculoskeletal:  Negative for joint pain and myalgias.  Neurological:  Negative for dizziness, tingling, tremors, sensory change, speech change, focal weakness, seizures, loss of consciousness, weakness and headaches.  Endo/Heme/Allergies:        NKDA.  Psychiatric/Behavioral:  Positive for depression (Improving.). Negative for suicidal ideas.    Blood pressure 98/62, pulse 65, temperature 97.7 F (36.5 C), temperature source Oral, resp. rate 16, height 5' 5 (1.651 m), weight 66.7 kg, SpO2 99%. Body mass index is 24.46 kg/m.  Treatment Plan Summary: Daily contact with patient to assess and evaluate symptoms and progress in treatment and Medication management.   Principal/active diagnoses.  MDD (major depressive disorder), severe (HCC) GAD (generalized anxiety disorder) Alcohol dependence  (HCC) Nicotine  dependence.   Medical issues:  Essential hypertension  Plan: The risks/benefits/side-effects/alternatives to the medications in use were discussed in detail with the patient and time was given for patient's questions. The patient consents to medication trial.   -- Continue Lexapro  20 mg oral immediate dose to equal 30 mg today (2/4) -- Continue naltrexone  50 mg oral daily for alcohol use disorder -- Continue Hydroxyzine  50 mg po tid prn for anxiety -- Continue Trazodone  50 mg po Q hs prn for sleep -- Haldol  BH Agitation Protocol (See MAR).  -- Nicotine  21 mg patch daily  -- Nicotine  cessation encouraged --Continue Librium  detox protocols for alcohol withdrawal management.   Medical issues.  --Continue Coreg  12.5 mg oral 2 times daily (Hold if SBP <=90, DBP <=60, HR <=60).   Other PRNS -Continue Tylenol  650 mg every 6 hours PRN for mild pain -Continue Maalox 30 ml Q 4 hrs PRN for indigestion -Continue MOM 30 ml po Q 6 hrs for constipation  Safety and Monitoring: Voluntary admission to inpatient psychiatric unit for safety, stabilization and treatment Daily contact with patient to assess and evaluate symptoms and progress in treatment Patient's case to be discussed in multi-disciplinary team meeting Observation Level : q15 minute checks Vital signs: q12 hours Precautions: Safety  Discharge Planning: Social work and case management to assist with discharge planning and identification of hospital follow-up needs prior to discharge Estimated LOS: 5-7 days Discharge Concerns: Need to establish a safety plan; Medication compliance and effectiveness Discharge Goals: Return home with outpatient referrals for mental health follow-up including medication management/psychotherapy  Mac Bolster, NP, pmhnp, fnp-bc. 01/08/2025, 4:36 PM

## 2025-01-08 NOTE — Group Note (Signed)
 Occupational Therapy Group Note  Group Topic:Coping Skills  Group Date: 01/08/2025 Start Time: 1500 End Time: 1530 Facilitators: Dot Dallas MATSU, OT   Group Description: Group encouraged increased engagement and participation through discussion and activity focused on Coping Ahead. Patients were split up into teams and selected a card from a stack of positive coping strategies. Patients were instructed to act out/charade the coping skill for other peers to guess and receive points for their team. Discussion followed with a focus on identifying additional positive coping strategies and patients shared how they were going to cope ahead over the weekend while continuing hospitalization stay.  Therapeutic Goal(s): Identify positive vs negative coping strategies. Identify coping skills to be used during hospitalization vs coping skills outside of hospital/at home Increase participation in therapeutic group environment and promote engagement in treatment   Participation Level: Engaged   Participation Quality: Independent   Behavior: Appropriate   Speech/Thought Process: Relevant   Affect/Mood: Appropriate   Insight: Fair   Judgement: Fair      Modes of Intervention: Education  Patient Response to Interventions:  Attentive   Plan: Continue to engage patient in OT groups 2 - 3x/week.  01/08/2025  [place] MATSU Dot, OT   Soleil Mas, OT

## 2025-01-08 NOTE — Progress Notes (Incomplete)
(  Sleep Hours) - (Any PRNs that were needed, meds refused, or side effects to meds)- trazodone  (Any disturbances and when (visitation, over night)- none reported (Concerns raised by the patient)- none reported (SI/HI/AVH)- denies all

## 2025-01-08 NOTE — Group Note (Signed)
 LCSW Group Therapy Note   Group Date: 01/08/2025 Start Time: 1100 End Time: 1200   Participation:  patient was present and actively participated in the discussion.  Type of Therapy:  Group Therapy   Topic:  Money Matters: Creating Stability, Confidence and Peace of Mind  Objective: To help participants understand the impact of financial stability on well-being through the lens of Maslow's Hierarchy of Needs and develop practical strategies for budgeting, saving, and debt repayment.  Goals: Increase awareness of spending habits and financial priorities, recognizing how money supports basic needs, security, and relationships. Develop simple budgeting and saving strategies to enhance stability and peace of mind.  Reduce financial stress by creating a realistic debt repayment plan, supporting long-term confidence and well-being.  Summary:  Participants explored how financial stability connects to basic needs, relationships, and self-esteem using Maslow's Hierarchy. They discussed budgeting, saving, and debt repayment strategies, identifying small, manageable changes. Through interactive discussion and self-reflection, they gained insight into their financial habits and created personal action steps for improvement.  Therapeutic Modalities Used: Elements of Cognitive Behavioral Therapy (CBT) - Addressing financial stress and thought patterns. Psychoeducation - Engineer, agricultural. Elements of Motivational Interviewing (MI) - Encouraging realistic, achievable changes. Group Support - Reducing shame and stress through shared experiences.   Azaliah Carrero O Acire Tang, LCSWA 01/08/2025  12:50 PM

## 2025-01-08 NOTE — Progress Notes (Signed)
" °   01/08/25 2300  Psych Admission Type (Psych Patients Only)  Admission Status Voluntary/72 hour document signed  Psychosocial Assessment  Patient Complaints Anxiety  Eye Contact Fair  Facial Expression Flat  Affect Irritable  Speech Logical/coherent  Interaction Minimal  Motor Activity Slow  Appearance/Hygiene Unremarkable  Behavior Characteristics Cooperative  Mood Anxious  Thought Process  Coherency WDL  Content WDL  Delusions None reported or observed  Perception WDL  Hallucination None reported or observed  Judgment Impaired  Confusion None  Danger to Self  Current suicidal ideation? Denies  Agreement Not to Harm Self Yes  Description of Agreement Verbal  Danger to Others  Danger to Others None reported or observed    "

## 2025-01-08 NOTE — Group Note (Signed)
 Date:  01/08/2025 Time:  9:41 AM  Group Topic/Focus: Goals Group Goals Group:   The focus of this group is to help patients establish daily goals to achieve during treatment and discuss how the patient can incorporate goal setting into their daily lives to aide in recovery.    Participation Level:  Did Not Attend  Participation Quality:  NA  Affect:  NA  Cognitive:  NA  Insight: NA  Engagement in Group:  NA  Modes of Intervention:  NA  Additional Comments:  Patient did not attend group.  Rosaleen BIRCH Yazan Gatling 01/08/2025, 9:41 AM

## 2025-01-08 NOTE — [facility] Group Notes (Addendum)
 Patient did attend Social Work Group.

## 2025-01-08 NOTE — [facility] Group Notes (Signed)
 Patient did attend Occupational Therapy Group.

## 2025-01-08 NOTE — [facility] Group Notes (Signed)
 Patient attended Nutrition Group.

## 2025-01-09 MED ORDER — NICOTINE 21 MG/24HR TD PT24: 21.0000 mg | MEDICATED_PATCH | Freq: Every day | TRANSDERMAL | 0 refills | Status: AC

## 2025-01-09 MED ORDER — ESCITALOPRAM OXALATE 20 MG PO TABS: 20.0000 mg | ORAL_TABLET | Freq: Every day | ORAL | 0 refills | Status: AC

## 2025-01-09 MED ORDER — NALTREXONE HCL 50 MG PO TABS: 50.0000 mg | ORAL_TABLET | Freq: Every day | ORAL | 0 refills | Status: AC

## 2025-01-09 MED ORDER — CARVEDILOL 12.5 MG PO TABS: 12.5000 mg | ORAL_TABLET | Freq: Two times a day (BID) | ORAL | 0 refills | Status: AC

## 2025-01-09 MED ORDER — HYDROXYZINE HCL 50 MG PO TABS: 50.0000 mg | ORAL_TABLET | Freq: Four times a day (QID) | ORAL | 0 refills | Status: AC | PRN

## 2025-01-09 MED ORDER — TRAZODONE HCL 50 MG PO TABS: 50.0000 mg | ORAL_TABLET | Freq: Every day | ORAL | 0 refills | Status: AC

## 2025-01-09 NOTE — Progress Notes (Signed)
 Pt discharged to lobby. Pt was stable and appreciative at that time. All papers and prescriptions were given and valuables returned. Verbal understanding expressed. Denies SI/HI and A/VH. Pt given opportunity to express concerns and ask questions.

## 2025-01-09 NOTE — BHH Suicide Risk Assessment (Signed)
 Suicide Risk Assessment  Discharge Assessment    [facility] Discharge Suicide Risk Assessment   Principal Problem: MDD (major depressive disorder), severe (HCC)  Discharge Diagnoses: Principal Problem:   MDD (major depressive disorder), severe (HCC) Active Problems:   GAD (generalized anxiety disorder)   Alcohol dependence (HCC)   Essential hypertension   Nicotine  dependence  Total Time spent with patient: 45 minutes  Musculoskeletal: Strength & Muscle Tone: within normal limits Gait & Station: normal Patient leans: N/A  Psychiatric Specialty Exam  Presentation  General Appearance:  Appropriate for Environment; Casual; Fairly Groomed  Eye Contact: Good  Speech: Clear and Coherent; Normal Rate  Speech Volume: Normal  Handedness:Right   Mood and Affect  Mood: Euthymic  Duration of Depression Symptoms: No data recorded Affect: Appropriate; Congruent   Thought Process  Thought Processes: Coherent; Goal Directed; Linear  Descriptions of Associations:Intact  Orientation:Full (Time, Place and Person)  Thought Content:Logical  History of Schizophrenia/Schizoaffective disorder:No data recorded Duration of Psychotic Symptoms:No data recorded Hallucinations:Hallucinations: None  Ideas of Reference:None  Suicidal Thoughts:Suicidal Thoughts: No  Homicidal Thoughts:Homicidal Thoughts: No   Sensorium  Memory: Immediate Good; Recent Good; Remote Good  Judgment: Good  Insight: Good   Executive Functions  Concentration: Good  Attention Span: Good  Recall: Good  Fund of Knowledge: Fair  Language: Good   Psychomotor Activity  Psychomotor Activity:Psychomotor Activity: Normal   Assets  Assets: Communication Skills; Desire for Improvement; Housing; Resilience; Social Support   Sleep  Sleep:Sleep: Good  Estimated Sleeping Duration (Last 24 Hours): 6.25-7.50 hours  Physical Exam: See the discharge summary please.  Blood pressure (!)  111/58, pulse 62, temperature 97.8 F (36.6 C), temperature source Oral, resp. rate 16, height 5' 5 (1.651 m), weight 66.7 kg, SpO2 100%. Body mass index is 24.46 kg/m.  Mental Status Per Nursing Assessment::   On Admission:  NA  Demographic Factors:  Adolescent or young adult  Loss Factors: Loss of significant relationship and Financial problems/change in socioeconomic status  Historical Factors: Impulsivity  Risk Reduction Factors:   Sense of responsibility to family, Living with another person, especially a relative, Positive social support, Positive therapeutic relationship, and Positive coping skills or problem solving skills  Continued Clinical Symptoms:  Depression:   Comorbid alcohol abuse/dependence Impulsivity Alcohol/Substance Abuse/Dependencies More than one psychiatric diagnosis Previous Psychiatric Diagnoses and Treatments Medical Diagnoses and Treatments/Surgeries  Cognitive Features That Contribute To Risk:  Polarized thinking and Thought constriction (tunnel vision)    Suicide Risk:  Minimal: No identifiable suicidal ideation.  Patients presenting with no risk factors but with morbid ruminations; may be classified as minimal risk based on the severity of the depressive symptoms   Follow-up Information     Inc, [facility]. Go on 01/12/2025.   Why: Please go to this provider on 01/12/25 at 8:30 am for an assessment, to obtain medication management services.  You may also go 24/7 for an initial assessment. Contact information: [address] [place] [address] [phone]         [facility]. Go on 01/14/2025.   Why: You have an appointment for therapy services on 01/14/25 at 9:30 am with [facility] .  The appointment will be held in person.  Please ring the front doorbell when you arrive.  The intake paperwork will be emailed to you, please submit prior to your appointment, or arrive 15 minutes early. Contact information: [address], [place], [place] [address]  Phone: [phone]  Plan Of Care/Follow-up recommendations:  See the discharge recommendation above.  Mac Bolster, NP, pmhnp, fnp-BC. 01/09/2025, 9:29 AM

## 2025-01-09 NOTE — Group Note (Signed)
 Date:  01/09/2025 Time:  10:48 AM  Group Topic/Focus: grab the mic Patients get a word and think of a lyric with that word in it.    Participation Level:  Active   Kush Farabee M Gil Ingwersen 01/09/2025, 10:48 AM

## 2025-01-09 NOTE — Transportation (Signed)
 01/09/2025  Jeilyn Stefan DOB: 26-Jul-1976 MRN: [id number]   RIDER WAIVER AND RELEASE OF LIABILITY  For the purposes of helping with transportation needs, [facility] partners with outside transportation providers (taxi companies, [place], catering manager.) to give [facility] patients or other approved people the choice of on-demand rides Public Librarian) to our buildings for non-emergency visits.  By using [employer], I, the person signing this document, on behalf of myself and/or any legal minors (in my care using the [employer]), agree:  Science Writer given to me are supplied by independent, outside transportation providers who do not work for, or have any affiliation with, [employer]. [place] is not a transportation company. [facility] has no control over the quality or safety of the rides I get using [employer]. [facility] has no control over whether any outside ride will happen on time or not. [facility] gives no guarantee on the reliability, quality, safety, or availability on any rides, or that no mistakes will happen. I know and accept that traveling by vehicle (car, truck, SVU, fleeta, bus, taxi, etc.) has risks of serious injuries such as disability, being paralyzed, and death. I know and agree the risk of using [employer] is mine alone, and not [employer]. [employer] are provided as is and as are available. The transportation providers are in charge for all inspections and care of the vehicles used to provide these rides. I agree not to take legal action against [facility], its agents, employees, officers, directors, representatives, insurers, attorneys, assigns, successors, subsidiaries, and affiliates at any time for any reasons related directly or indirectly to using [employer]. I also agree not to take legal action against [facility] or its affiliates for any injury, death, or damage to property caused by or related to using  [employer]. I have read this Waiver and Release of Liability, and I understand the terms used in it and their legal meaning. This Waiver is freely and voluntarily given with the understanding that my right (or any legal minors) to legal action against [facility] relating to [employer] is knowingly given up to use these services.   I attest that I read the Ride Waiver and Release of Liability to [facility], gave Ms. Fordham the opportunity to ask questions and answered the questions asked (if any). I affirm that [facility] then provided consent for assistance with transportation.

## 2025-01-09 NOTE — Plan of Care (Signed)

## 2025-01-09 NOTE — BHH Suicide Risk Assessment (Signed)
 [facility] INPATIENT:  Family/Significant Other Suicide Prevention Education  Suicide Prevention Education:  Education Completed; Brenda Bryan (son) [phone],  (name of family member/significant other) has been identified by the patient as the family member/significant other with whom the patient will be residing, and identified as the person(s) who will aid the patient in the event of a mental health crisis (suicidal ideations/suicide attempt).  With written consent from the patient, the family member/significant other has been provided the following suicide prevention education, prior to the and/or following the discharge of the patient.  The suicide prevention education provided includes the following: Suicide risk factors Suicide prevention and interventions National Suicide Hotline telephone number [facility] assessment telephone number [facility] Emergency Assistance 911 [facility] and/or Residential Mobile Crisis Unit telephone number  Request made of family/significant other to: Remove weapons (e.g., guns, rifles, knives), all items previously/currently identified as safety concern.   Remove drugs/medications (over-the-counter, prescriptions, illicit drugs), all items previously/currently identified as a safety concern.  Brenda Bryan denies any presence of weapons in the home and agrees to the requests made above. Brenda Bryan does not have any concerns regarding patient discharging today, and confirms the patient may be discharged back to his home. Brenda Bryan requests for CSW to assist with transportation as he will not be available for pick up.   The family member/significant other verbalizes understanding of the suicide prevention education information provided.  The family member/significant other agrees to remove the items of safety concern listed above.  Brenda Bryan 01/09/2025, 8:28 AM

## 2025-01-09 NOTE — Plan of Care (Signed)
   Problem: Education: Goal: Emotional status will improve Outcome: Progressing Goal: Mental status will improve Outcome: Progressing   Problem: Activity: Goal: Interest or engagement in activities will improve Outcome: Progressing

## 2025-01-09 NOTE — Discharge Summary (Incomplete)
 " Physician Discharge Summary Note  Patient:  Brenda Bryan is an 49 y.o., female MRN:  [id number] DOB:  1976/02/06 Patient phone:  [phone] (home)  Patient address:   [address] [place] [address],  Total Time spent with patient: 45 minutes  Date of Admission:  01/06/2025  Date of Discharge: 01-09-25  Reason for Admission:  An alleged suicidal gesture involving self-inflicted superficial lacerations to her left wrist with scissors in the context of an attempt to end her life.  Principal Problem: MDD (major depressive disorder), severe (HCC) Discharge Diagnoses: Principal Problem:   MDD (major depressive disorder), severe (HCC) Active Problems:   GAD (generalized anxiety disorder)   Alcohol dependence (HCC)   Essential hypertension   Nicotine  dependence  Past Psychiatric History: MDD, GAD.  Past Medical History:  Past Medical History:  Diagnosis Date   Breast cancer [facility])     Past Surgical History:  Procedure Laterality Date   LAPAROSCOPIC BILATERAL SALPINGO OOPHERECTOMY     MASTECTOMY Bilateral    Family History:  Family History  Problem Relation Age of Onset   Heart disease Mother    COPD Father    Hepatitis C Father    Family Psychiatric  History: See H&P. Social History:  Social History   Substance and Sexual Activity  Alcohol Use Yes   Alcohol/week: 3.0 standard drinks of alcohol   Types: 3 Glasses of wine per week     Social History   Substance and Sexual Activity  Drug Use Never    Social History   Socioeconomic History   Marital status: Married    Spouse name: Not on file   Number of children: Not on file   Years of education: Not on file   Highest education level: Not on file  Occupational History   Not on file  Tobacco Use   Smoking status: Every Day    Current packs/day: 0.50    Types: Cigarettes   Smokeless tobacco: Never   Tobacco comments:    Vapes (nicotine ) 2 cartridges/ week   Substance and Sexual Activity   Alcohol use: Yes     Alcohol/week: 3.0 standard drinks of alcohol    Types: 3 Glasses of wine per week   Drug use: Never   Sexual activity: Not on file  Other Topics Concern   Not on file  Social History Narrative   Not on file   Social Drivers of Health   Tobacco Use: High Risk (01/06/2025)   Patient History    Smoking Tobacco Use: Every Day    Smokeless Tobacco Use: Never    Passive Exposure: Not on file  Financial Resource Strain: Not on file  Food Insecurity: No Food Insecurity (01/06/2025)   Epic    Worried About Programme Researcher, Broadcasting/film/video in the Last Year: Never true    Ran Out of Food in the Last Year: Never true  Transportation Needs: No Transportation Needs (01/06/2025)   Epic    Lack of Transportation (Medical): No    Lack of Transportation (Non-Medical): No  Physical Activity: Not on file  Stress: Not on file  Social Connections: Not on file  Depression (EYV7-0): Not on file  Alcohol Screen: High Risk (01/06/2025)   Alcohol Screen    Last Alcohol Screening Score (AUDIT): 25  Housing: Low Risk (01/06/2025)   Epic    Unable to Pay for Housing in the Last Year: No    Number of Times Moved in the Last Year: 1  Homeless in the Last Year: No  Utilities: Not At Risk (01/06/2025)   Epic    Threatened with loss of utilities: No  Health Literacy: Not on file   Hospital Course: (Per admission evaluation notes): 49 year old female with a reported history of major depressive disorder, generalized anxiety disorder with panic attacks, and alcohol use disorder. She initially presented to [facility] following an alleged suicidal gesture involving self-inflicted superficial lacerations to her left wrist with scissors in the context of an attempt to end her life. She endorsed worsening depressive symptoms, increased alcohol use, and multiple psychosocial stressors over the preceding three weeks, including a recent breakup with her boyfriend, financial concerns, and recurrent panic attacks. Given the acute  safety concerns, after medical clearance, the patient was admitted to the [facility] for safety, stabilization, and further psychiatric evaluation and treatment.   Physical Findings: AIMS:  , ,  ,  ,  ,  ,   CIWA:  CIWA-Ar Total: 0 COWS:     Musculoskeletal: Strength & Muscle Tone: within normal limits Gait & Station: normal Patient leans: N/A   Psychiatric Specialty Exam:  Presentation  General Appearance:  Appropriate for Environment; Casual; Fairly Groomed  Eye Contact: Good  Speech: Clear and Coherent; Normal Rate  Speech Volume: Normal  Handedness: Right   Mood and Affect  Mood: Euthymic  Affect: Appropriate; Congruent   Thought Process  Thought Processes: Coherent; Goal Directed; Linear  Descriptions of Associations:Intact  Orientation:Full (Time, Place and Person)  Thought Content:Logical  History of Schizophrenia/Schizoaffective disorder:No data recorded Duration of Psychotic Symptoms:No data recorded Hallucinations:Hallucinations: None  Ideas of Reference:None  Suicidal Thoughts:Suicidal Thoughts: No  Homicidal Thoughts:Homicidal Thoughts: No   Sensorium  Memory: Immediate Good; Recent Good; Remote Good  Judgment: Good  Insight: Good   Executive Functions  Concentration: Good  Attention Span: Good  Recall: Good  Fund of Knowledge: Fair  Language: Good  Psychomotor Activity  Psychomotor Activity: Psychomotor Activity: Normal  Assets  Assets: Communication Skills; Desire for Improvement; Housing; Resilience; Social Support  Sleep  Sleep: Sleep: Good  Estimated Sleeping Duration (Last 24 Hours): 6.25-7.50 hours  Physical Exam: Physical Exam Vitals and nursing note reviewed.  HENT:     Head: Normocephalic.     Nose: Nose normal.     Mouth/Throat:     Pharynx: Oropharynx is clear.  Cardiovascular:     Rate and Rhythm: Normal rate.     Pulses: Normal pulses.  Pulmonary:     Effort:  Pulmonary effort is normal.  Genitourinary:    Comments: Deferred. Musculoskeletal:        General: Normal range of motion.     Cervical back: Normal range of motion.  Skin:    General: Skin is dry.  Neurological:     General: No focal deficit present.     Mental Status: She is alert and oriented to person, place, and time.    Review of Systems  Constitutional:  Negative for chills, diaphoresis and fever.  HENT:  Negative for congestion and sore throat.   Respiratory:  Negative for cough, shortness of breath and wheezing.   Cardiovascular:  Negative for chest pain and palpitations.  Gastrointestinal:  Negative for abdominal pain, constipation, diarrhea, heartburn, nausea and vomiting.  Genitourinary:  Negative for dysuria.  Musculoskeletal:  Negative for joint pain and myalgias.  Skin:  Negative for itching and rash.  Neurological:  Negative for dizziness, tingling, tremors, sensory change, speech change, focal weakness, seizures, loss of consciousness, weakness  and headaches.  Endo/Heme/Allergies:        NKDA.  Psychiatric/Behavioral:  Positive for depression (Stable.) and substance abuse (hx. alcoholism.). Negative for hallucinations, memory loss and suicidal ideas. The patient has insomnia (Stable). The patient is not nervous/anxious (stable.).    Blood pressure (!) 111/58, pulse 62, temperature 97.8 F (36.6 C), temperature source Oral, resp. rate 16, height 5' 5 (1.651 m), weight 66.7 kg, SpO2 100%. Body mass index is 24.46 kg/m.  Tobacco Use History[1] Tobacco Cessation:  An FDA-approved tobacco cessation medication recommended at discharge  Blood Alcohol level:  No results found for: [facility]  Metabolic Disorder Labs:  Lab Results  Component Value Date   HGBA1C 4.6 (L) 01/08/2025   MPG 85.32 01/08/2025   No results found for: PROLACTIN Lab Results  Component Value Date   CHOL 190 01/08/2025   TRIG 79 01/08/2025   HDL 86 01/08/2025   CHOLHDL 2.2 01/08/2025    VLDL 16 01/08/2025   LDLCALC 88 01/08/2025   See Psychiatric Specialty Exam and Suicide Risk Assessment completed by Attending Physician prior to discharge.  Discharge destination:  Home  Is patient on multiple antipsychotic therapies at discharge:  No   Has Patient had three or more failed trials of antipsychotic monotherapy by history:  No  Recommended Plan for Multiple Antipsychotic Therapies: NA   Allergies as of 01/09/2025   No Known Allergies      Medication List     STOP taking these medications    ALPRAZolam 0.25 MG tablet Commonly known as: XANAX       TAKE these medications      Indication  carvedilol  12.5 MG tablet Commonly known as: COREG  Take 1 tablet (12.5 mg total) by mouth 2 (two) times daily. For hypertension. What changed: additional instructions  Indication: High Blood Pressure   escitalopram  20 MG tablet Commonly known as: LEXAPRO  Take 1 tablet (20 mg total) by mouth daily. For depression/anxiety Start taking on: January 10, 2025 What changed:  when to take this additional instructions  Indication: Generalized Anxiety Disorder, Major Depressive Disorder   hydrOXYzine  50 MG tablet Commonly known as: ATARAX  Take 1 tablet (50 mg total) by mouth every 6 (six) hours as needed for anxiety.  Indication: Feeling Anxious   naltrexone  50 MG tablet Commonly known as: DEPADE Take 1 tablet (50 mg total) by mouth daily. For alcoholism. Start taking on: January 10, 2025  Indication: Abuse or Misuse of Alcohol   nicotine  21 mg/24hr patch Commonly known as: NICODERM CQ  - dosed in mg/24 hours Place 1 patch (21 mg total) onto the skin daily. (May buy from over the counter): For smoking cessation. Start taking on: January 10, 2025  Indication: Nicotine  Addiction   traZODone  50 MG tablet Commonly known as: DESYREL  Take 1 tablet (50 mg total) by mouth at bedtime. For sleep.  Indication: Trouble Sleeping        Follow-up [facility]. Go on 01/12/2025.   Why: Please go to this provider on 01/12/25 at 8:30 am for an assessment, to obtain medication management services.  You may also go 24/7 for an initial assessment. Contact information: [address] [place] [address] [phone]         [facility]. Go on 01/14/2025.   Why: You have an appointment for therapy services on 01/14/25 at 9:30 am with [facility] .  The appointment will be held in person.  Please ring the front doorbell when you  arrive.  The intake paperwork will be emailed to you, please submit prior to your appointment, or arrive 15 minutes early. Contact information: [address] [place], [place] [address]  Phone: [phone]               Plan Of Care/Follow-up recommendations:  Activity: as tolerated  Diet: heart healthy  Other: -Follow-up with your outpatient psychiatric provider -instructions on appointment date, time, and address (location) are provided to you in discharge paperwork.  -Take your psychiatric medications as prescribed at discharge - instructions are provided to you in the discharge paperwork  -Follow-up with outpatient primary care doctor and other specialists -for management of preventative medicine and chronic medical issues  -Testing: Follow-up with outpatient provider for abnormal lab results: NA  -If you are prescribed an atypical antipsychotic medication, we recommend that your outpatient psychiatrist follow routine screening for side effects within 3 months of discharge, including monitoring: AIMS scale, height, weight, blood pressure, fasting lipid panel, HbA1c, and fasting blood sugar.   -Recommend total abstinence from alcohol, tobacco, and other illicit drug use at discharge.   -If your psychiatric symptoms recur, worsen, or if you have side effects to your psychiatric medications, call your outpatient psychiatric provider, 911, [phone] or go to the nearest emergency  department.  -If suicidal thoughts occur, immediately call your outpatient psychiatric provider, 911, [phone] or go to the nearest emergency department. Signed: Mac Bolster, NP, pmhnp, fnp-bc. 01/09/2025, 5:44 PM           [1]  Social History Tobacco Use  Smoking Status Every Day   Current packs/day: 0.50   Types: Cigarettes  Smokeless Tobacco Never  Tobacco Comments   Vapes (nicotine ) 2 cartridges/ week    "

## 2025-01-09 NOTE — Progress Notes (Signed)
(  Sleep Hours) - 7.5 (Any PRNs that were needed, meds refused, or side effects to meds)- N/A (Any disturbances and when (visitation, over night)- N/A (Concerns raised by the patient)- N/A (SI/HI/AVH)- DENIES

## 2025-01-09 NOTE — Progress Notes (Signed)
" °  [facility] Adult Case Management Discharge Plan :  Will you be returning to the same living situation after discharge:  Yes,  patient will be returning to son's home, address on file.  At discharge, do you have transportation home?: No. CSW arranged taxi voucher through [employer] for 10:30am.  Do you have the ability to pay for your medications: Yes,  patient has active health insurance.  Release of information consent forms completed and in the chart;  Patient's signature needed at discharge.  Patient to Follow up at:  Follow-up Information     [facility], [facility]. Go on 01/12/2025.   Why: Please go to this provider on 01/12/25 at 8:30 am for an assessment, to obtain medication management services.  You may also go 24/7 for an initial assessment. Contact information: [address] [place] [address] [phone]         [facility]. Go on 01/14/2025.   Why: You have an appointment for therapy services on 01/14/25 at 9:30 am with [facility] .  The appointment will be held in person.  Please ring the front doorbell when you arrive.  The intake paperwork will be emailed to you, please submit prior to your appointment, or arrive 15 minutes early. Contact information: [address], [place], [place] [address]  Phone: [phone]                Next level of care provider has access to [facility] Link:no  Safety Planning and Suicide Prevention discussed: Yes,  completed with Jennfier Abdulla (son)  [phone].      Has patient been referred to the Quitline?: Patient refused referral for treatment  Patient has been referred for addiction treatment: Patient refused referral for treatment.  Louetta Lame, LCSWA 01/09/2025, 8:51 AM "

## 2025-01-09 NOTE — Group Note (Signed)
 Date:  01/09/2025 Time:  10:25 AM  Group Topic/Focus: Goals and orientation Goals Group:   The focus of this group is to help patients establish daily goals to achieve during treatment and discuss how the patient can incorporate goal setting into their daily lives to aide in recovery. Orientation:   The focus of this group is to educate the patient on the purpose and policies of crisis stabilization and provide a format to answer questions about their admission.  The group details unit policies and expectations of patients while admitted.    Participation Level:  Active  Participation Quality:  Appropriate  Affect:  Appropriate  Cognitive:  Alert  Insight: Appropriate  Engagement in Group:  Engaged  Modes of Intervention:  Discussion  Additional Comments:    Brenda Bryan 01/09/2025, 10:25 AM

## 2025-01-09 NOTE — Progress Notes (Signed)
" °   01/09/25 0800  Psych Admission Type (Psych Patients Only)  Admission Status Voluntary/72 hour document signed  Psychosocial Assessment  Patient Complaints None  Eye Contact Fair  Facial Expression Flat  Affect Appropriate to circumstance  Speech Logical/coherent  Interaction Assertive  Motor Activity Slow  Appearance/Hygiene Unremarkable  Behavior Characteristics Cooperative;Appropriate to situation  Mood Pleasant  Thought Process  Coherency WDL  Content WDL  Delusions None reported or observed  Perception WDL  Hallucination None reported or observed  Judgment WDL  Confusion WDL  Danger to Self  Current suicidal ideation? Denies  Description of Suicide Plan No Plan  Agreement Not to Harm Self Yes  Description of Agreement Verbal  Danger to Others  Danger to Others None reported or observed    "
